# Patient Record
Sex: Female | Born: 1970 | Race: White | Hispanic: No | Marital: Married | Smoking: Never smoker
Health system: Southern US, Community
[De-identification: ages and names within clinical notes are randomized; demographics above are authoritative.]

## PROBLEM LIST (undated history)

## (undated) DIAGNOSIS — Z91018 Allergy to other foods: Secondary | ICD-10-CM

## (undated) DIAGNOSIS — I10 Essential (primary) hypertension: Secondary | ICD-10-CM

## (undated) DIAGNOSIS — R0683 Snoring: Secondary | ICD-10-CM

## (undated) DIAGNOSIS — R000 Tachycardia, unspecified: Secondary | ICD-10-CM

## (undated) DIAGNOSIS — I441 Atrioventricular block, second degree: Secondary | ICD-10-CM

## (undated) HISTORY — PX: ABLATION: SHX5711

## (undated) HISTORY — DX: Tachycardia, unspecified: R00.0

## (undated) HISTORY — PX: TUBAL LIGATION: SHX77

## (undated) HISTORY — DX: Essential (primary) hypertension: I10

## (undated) HISTORY — DX: Atrioventricular block, second degree: I44.1

## (undated) HISTORY — DX: Allergy to other foods: Z91.018

## (undated) HISTORY — PX: CHOLECYSTECTOMY: SHX55

## (undated) HISTORY — DX: Snoring: R06.83

---

## 2009-04-05 LAB — CONVERTED CEMR LAB: Pap Smear: NORMAL

## 2010-06-15 ENCOUNTER — Other Ambulatory Visit: Admission: RE | Admit: 2010-06-15 | Discharge: 2010-06-15 | Payer: Self-pay | Admitting: Family Medicine

## 2010-06-15 ENCOUNTER — Ambulatory Visit: Payer: Self-pay | Admitting: Family Medicine

## 2010-06-15 DIAGNOSIS — Z8711 Personal history of peptic ulcer disease: Secondary | ICD-10-CM

## 2010-06-15 DIAGNOSIS — E781 Pure hyperglyceridemia: Secondary | ICD-10-CM

## 2010-06-15 DIAGNOSIS — Z9189 Other specified personal risk factors, not elsewhere classified: Secondary | ICD-10-CM | POA: Insufficient documentation

## 2010-06-15 DIAGNOSIS — M722 Plantar fascial fibromatosis: Secondary | ICD-10-CM | POA: Insufficient documentation

## 2010-06-15 DIAGNOSIS — I10 Essential (primary) hypertension: Secondary | ICD-10-CM | POA: Insufficient documentation

## 2010-06-15 DIAGNOSIS — J45909 Unspecified asthma, uncomplicated: Secondary | ICD-10-CM

## 2010-06-15 DIAGNOSIS — G43009 Migraine without aura, not intractable, without status migrainosus: Secondary | ICD-10-CM | POA: Insufficient documentation

## 2010-06-15 DIAGNOSIS — J309 Allergic rhinitis, unspecified: Secondary | ICD-10-CM | POA: Insufficient documentation

## 2010-06-17 LAB — CONVERTED CEMR LAB
ALT: 15 units/L (ref 0–35)
AST: 14 units/L (ref 0–37)
Albumin: 4.1 g/dL (ref 3.5–5.2)
BUN: 14 mg/dL (ref 6–23)
Chloride: 103 meq/L (ref 96–112)
Cholesterol: 217 mg/dL — ABNORMAL HIGH (ref 0–200)
Creatinine, Ser: 0.7 mg/dL (ref 0.4–1.2)
Direct LDL: 83 mg/dL
Glucose, Bld: 84 mg/dL (ref 70–99)
HDL: 39.4 mg/dL (ref >39.00)
Total Bilirubin: 0.7 mg/dL (ref 0.3–1.2)
VLDL: 44.8 mg/dL — ABNORMAL HIGH (ref 0.0–40.0)

## 2010-06-22 LAB — CONVERTED CEMR LAB: Pap Smear: NEGATIVE

## 2010-06-23 ENCOUNTER — Encounter (INDEPENDENT_AMBULATORY_CARE_PROVIDER_SITE_OTHER): Payer: Self-pay | Admitting: *Deleted

## 2010-10-05 NOTE — Letter (Signed)
Summary: Results Follow up Letter  [place] [facility]  [address] [place], [place] [address]   Phone: [phone]  Fax: [phone]    06/23/2010 MRN: [id number]     [facility] [address] [place], [place]  [address]    Dear [name],  The following are the results of your recent test(s):  Test         Result    Pap Smear:        Normal __x___  Not Normal _____ Comments:Repeat in 1 year ______________________________________________________ Cholesterol: LDL(Bad cholesterol):         Your goal is less than:         HDL (Good cholesterol):       Your goal is more than: Comments:  ______________________________________________________ Mammogram:        Normal _____  Not Normal _____ Comments:  ___________________________________________________________________ Hemoccult:        Normal _____  Not normal _______ Comments:    _____________________________________________________________________ Other Tests:    We routinely do not discuss normal results over the telephone.  If you desire a copy of the results, or you have any questions about this information we can discuss them at your next office visit.   Sincerely,  Kerby Nora MD

## 2010-10-05 NOTE — Assessment & Plan Note (Signed)
Summary: NEW PT TO EST/CPX/PAP SMEAR/CLE   Vital Signs:  Patient profile:   40 year old female Height:      67 inches Weight:      188 pounds BMI:     29.55 Temp:     98.6 degrees F oral Pulse rate:   76 / minute Pulse rhythm:   regular BP sitting:   130 / 90  (left arm) Cuff size:   regular  Vitals Entered By: Benny Lennert CMA Duncan Dull) (June 15, 2010 9:55 AM)  History of Present Illness: Chief complaint new patient to be established   [place] from [place], [place].  Here to establish.  The patient is here for annual wellness exam and preventative care.    Allergies, mild worsening control..since moving. USes Zyrtec intermittantly.  HTN, well controlled on Bystolic since 10/2008. Strong family history of HTN.  Migraines, occuring 1 every 1-2 months.  Suing excedrine migraine or aleve. Triptans didn't help in past.   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  HAd nml iver, kidney, nml chol..mild increase in triglycerides, no DM....last year.   Hypertension History:      She denies headache, chest pain, palpitations, dyspnea with exertion, peripheral edema, neurologic problems, and syncope.        Positive major cardiovascular risk factors include hyperlipidemia and hypertension.  Negative major cardiovascular risk factors include female age less than 69 years old and non-tobacco-user status.    Preventive Screening-Counseling & Management  Alcohol-Tobacco     Smoking Status: never  Caffeine-Diet-Exercise     Does Patient Exercise: no      Drug Use:  no.    Problems Prior to Update: None  Current Medications (verified): 1)  None  Past History:  Past medical, surgical, family and social histories (including risk factors) reviewed, and no changes noted (except as noted below).  Past Medical History: Current Problems:  ALLERGIC RHINITIS (ICD-477.9) HYPERTENSION (ICD-401.9) GASTRIC ULCER, HX OF (ICD-V12.71) COMMON MIGRAINE  (ICD-346.10) CHICKENPOX, HX OF (ICD-V15.9) ASTHMA (ICD-493.90)    Past Surgical History: gallbladder 1998 csection 2000 tubal 06/2006 S/P novasure..no menses.  Family History: Reviewed history and no changes required. Family History of Arthritis Family History of Colon CA 1st degree relative <60 Family History Diabetes 1st degree relative Family History Hypertension Family History of Respiratory disease(CPOD, ENPHYSEMA)  father: HTN, COPD,  mother: healthy, DDD brtoher: HTN, DDD  Social History: Reviewed history and no changes required. 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 Drug use-no Regular exercise-no  4 children  Diet: healthy, fruits and veggies, water, mild calcium intake. Occupation:  employed Smoking Status:  never Drug Use:  no Does Patient Exercise:  no  Physical Exam  General:  Well-developed,well-nourished,in no acute distress; alert,appropriate and cooperative throughout examination Eyes:  No corneal or conjunctival inflammation noted. EOMI. Perrla. Funduscopic exam benign, without hemorrhages, exudates or papilledema. Vision grossly normal. Ears:  External ear exam shows no significant lesions or deformities.  Otoscopic examination reveals clear canals, tympanic membranes are intact bilaterally without bulging, retraction, inflammation or discharge. Hearing is grossly normal bilaterally. Nose:  External nasal examination shows no deformity or inflammation. Nasal mucosa are pink and moist without lesions or exudates. Mouth:  Oral mucosa and oropharynx without lesions or exudates.  Teeth in good repair. Neck:  no carotid bruit or thyromegaly no cervical or supraclavicular lymphadenopathy  Breasts:  No mass, nodules, thickening, tenderness, bulging, retraction, inflamation, nipple discharge or skin changes noted.  Lungs:  Normal respiratory effort, chest expands symmetrically. Lungs are clear to auscultation,  no crackles or wheezes. Heart:  Normal rate and regular rhythm. S1 and S2 normal without gallop, murmur, click, rub or other extra sounds. Abdomen:  Bowel sounds positive,abdomen soft and non-tender without masses, organomegaly or hernias noted. Genitalia:  Pelvic Exam:        External: normal female genitalia without lesions or masses        Vagina: normal without lesions or masses        Cervix: normal without lesions or masses        Adnexa: normal bimanual exam without masses or fullness        Uterus: normal by palpation        Pap smear: performed Pulses:  R and L posterior tibial pulses are full and equal bilaterally  Extremities:  no edema Neurologic:  No cranial nerve deficits noted. Station and gait are normal. Plantar reflexes are down-going bilaterally. DTRs are symmetrical throughout. Sensory, motor and coordinative functions appear intact. Skin:  Intact without suspicious lesions or rashes Psych:  Cognition and judgment appear intact. Alert and cooperative with normal attention span and concentration. No apparent delusions, illusions, hallucinations   Impression & Recommendations:  Problem # 1:  PREVENTIVE HEALTH CARE (ICD-V70.0) The patient's preventative maintenance and recommended screening tests for an annual wellness exam were reviewed in full today. Brought up to date unless services declined.  Counselled on the importance of diet, exercise, and its role in overall health and mortality. The patient's FH and SH was reviewed, including their home life, tobacco status, and drug and alcohol status.     Problem # 2:  ROUTINE GYNECOLOGICAL EXAMINATION (ICD-V72.31) PAP pending.Marland Kitchenobtain last 3 years of paps...if nml space every 2-3 years.   Problem # 3:  HYPERTRIGLYCERIDEMIA (ICD-272.1)  Orders: TLB-Lipid Panel (80061-LIPID)  Problem # 4:  HYPERTENSION (ICD-401.9) Well controlled. Continue current medication.  Orders: TLB-BMP (Basic Metabolic Panel-BMET)  (80048-METABOL) TLB-Hepatic/Liver Function Pnl (80076-HEPATIC)  Hypertension Assessment/Plan:      The patient's hypertensive risk group is category B: At least one risk factor (excluding diabetes) with no target organ damage.  Today's blood pressure is 130/90.  Her blood pressure goal is < 140/90.   Patient Instructions: 1)  Change tylenol PM to tylenol alone..try to use only as needed. 2)  Start regular exercsie as tolerated..consider water exercise. 3)  Take calcium/vit D 600mg /400 IU daily. 4)  Please schedule a follow-up appointment in 1 year.   Flu Vaccine Result Date:  06/15/2010 Flu Vaccine Result:  given Flu Vaccine Next Due:  1 yr TD Result Date:  06/15/2009 TD Result:  given TD Next Due:  10 yr PAP Result Date:  04/05/2009 PAP Result:  normal PAP Next Due:  1 yr HAs normal PAP s yearly always nml.   Appended Document: NEW PT TO EST/CPX/PAP SMEAR/CLE Flu Vaccine Consent Questions     Do you have a history of severe allergic reactions to this vaccine? no    Any prior history of allergic reactions to egg and/or gelatin? no    Do you have a sensitivity to the preservative Thimersol? no    Do you have a past history of Guillan-Barre Syndrome? no    Do you currently have an acute febrile illness? no    Have you ever had a severe reaction to latex? no    Vaccine information given and explained to patient? yes    Are you currently pregnant? no  Lot Number:[id number]   Exp Date:03/05/2011   Site Given  Left Deltoid IM    Clinical Lists Changes  Orders: Added new Service order of Admin 1st Vaccine ([address]) - Signed Added new Service order of Flu Vaccine 24yrs + [phone]) - Signed Observations: Added new observation of FLU VAX VIS: 03/30/10 version (06/15/2010 11:16) Added new observation of FLU VAXLOT: [id number] (06/15/2010 11:16) Added new observation of FLU VAXMFR: [employer] (06/15/2010 11:16) Added new observation of FLU VAX EXP: 03/05/2011 (06/15/2010  11:16) Added new observation of FLU VAX DSE: 0.39ml (06/15/2010 11:16) Added new observation of FLU VAX: Fluvax 3+ (06/15/2010 11:16)

## 2010-10-05 NOTE — Letter (Signed)
Summary: Records Dated 10-08-07 thru 04-15-09/Christopher Radpour MD  Records Dated 10-08-07 thru 04-15-09/Christopher Radpour MD   Imported By: Lanelle Bal 06/29/2010 08:38:53  _____________________________________________________________________  External Attachment:    Type:   Image     Comment:   External Document

## 2012-07-02 ENCOUNTER — Encounter: Payer: Self-pay | Admitting: Family Medicine

## 2012-07-02 ENCOUNTER — Ambulatory Visit (INDEPENDENT_AMBULATORY_CARE_PROVIDER_SITE_OTHER): Payer: BC Managed Care – PPO | Admitting: Family Medicine

## 2012-07-02 VITALS — BP 140/90 | HR 72 | Temp 98.2°F | Wt 208.0 lb

## 2012-07-02 DIAGNOSIS — J029 Acute pharyngitis, unspecified: Secondary | ICD-10-CM

## 2012-07-02 DIAGNOSIS — I10 Essential (primary) hypertension: Secondary | ICD-10-CM

## 2012-07-02 DIAGNOSIS — Z136 Encounter for screening for cardiovascular disorders: Secondary | ICD-10-CM

## 2012-07-02 LAB — COMPREHENSIVE METABOLIC PANEL
ALT: 20 U/L (ref 0–35)
AST: 18 U/L (ref 0–37)
Alkaline Phosphatase: 60 U/L (ref 39–117)
BUN: 11 mg/dL (ref 6–23)
Calcium: 8.9 mg/dL (ref 8.4–10.5)
Chloride: 104 mEq/L (ref 96–112)
Creatinine, Ser: 0.8 mg/dL (ref 0.4–1.2)
Total Bilirubin: 0.7 mg/dL (ref 0.3–1.2)

## 2012-07-02 LAB — LDL CHOLESTEROL, DIRECT: Direct LDL: 107.7 mg/dL

## 2012-07-02 LAB — LIPID PANEL
HDL: 43.1 mg/dL (ref >39.00)
Total CHOL/HDL Ratio: 7
Triglycerides: 419 mg/dL — ABNORMAL HIGH (ref 0.0–149.0)
VLDL: 83.8 mg/dL — ABNORMAL HIGH (ref 0.0–40.0)

## 2012-07-02 LAB — POCT RAPID STREP A (OFFICE): Rapid Strep A Screen: NEGATIVE

## 2012-07-02 MED ORDER — PENICILLIN G BENZATHINE 600000 UNIT/ML IM SUSP: 600000.0000 [IU] | Freq: Once | INTRAMUSCULAR | Status: DC

## 2012-07-02 MED ORDER — PENICILLIN G BENZATHINE 600000 UNIT/ML IM SUSP: 60000.0000 [IU] | Freq: Once | INTRAMUSCULAR | Status: DC

## 2012-07-02 MED ORDER — NEBIVOLOL HCL 10 MG PO TABS: 10.0000 mg | ORAL_TABLET | Freq: Every day | ORAL | Status: DC

## 2012-07-02 MED ORDER — PENICILLIN G BENZATHINE 1200000 UNIT/2ML IM SUSP
1.2000 10*6.[IU] | Freq: Once | INTRAMUSCULAR | Status: AC
  Administered 2012-07-02: 1.2 10*6.[IU] via INTRAMUSCULAR

## 2012-07-02 NOTE — Progress Notes (Signed)
  Subjective:    Patient ID: Alexis Hoffman, female    DOB: 03/04/1971, 41 y.o.   MRN: [id number]  HPI  41 yo pt of Dr. Ermalene Searing who has not been here since 2011 here for:  1. Strep throat- She complains of sore throat, myalgias, swollen glands, headache and fever for 20 days. No history of rheumatic fever.   She was treated for strep throat two weeks ago with [facility]N 500 [facility] but feels symptoms are persisting.   2.  BP medication refill.  Has been on Bystolic since 2010.  Strong FH of HTN. Denies any HA, blurred vision, CP or SOB.  Patient Active Problem List  Diagnosis  . HYPERTRIGLYCERIDEMIA  . COMMON MIGRAINE  . HYPERTENSION  . ALLERGIC RHINITIS  . ASTHMA, INTERMITTENT, MILD  . PLANTAR FASCIITIS, RIGHT  . GASTRIC ULCER, HX OF  . CHICKENPOX, HX OF  . Pharyngitis   Past Medical History  Diagnosis Date  . Hypertension    Past Surgical History  Procedure Date  . Cholecystectomy   . Tubal ligation   . Cesarean section    History  Substance Use Topics  . Smoking status: Never Smoker   . Smokeless tobacco: Not on file  . Alcohol Use: Not on file   No family history on file. Allergies  Allergen Reactions  . Codeine Nausea And Vomiting  . Vicodin (Hydrocodone-Acetaminophen) Hives   Current Outpatient Prescriptions on File Prior to Visit  Medication Sig Dispense Refill  . nebivolol (BYSTOLIC) 10 MG tablet Take 1 tablet (10 mg total) by mouth daily.  90 tablet  3   The PMH, PSH, Social History, Family History, Medications, and allergies have been reviewed in [facility], and have been updated if relevant.     Review of Systems See HPI    Objective:   Physical Exam  BP 140/90  Pulse 72  Temp 98.2 F (36.8 C)  Wt 208 lb (94.348 kg)  Appears alert, well appearing, and in no distress. Ears: bilateral TM's and external ear canals normal Oropharynx: erythematous and tonsils hypertrophied with exudate Neck: supple, no significant  adenopathy Lungs: clear to auscultation, no wheezes, rales or rhonchi, symmetric air entry Rapid Strep test is negative      Assessment & Plan:   1. HYPERTENSION  Stable.  Bystolic refilled.  Check CMET today since she has not had labs checked since 2011. Comprehensive metabolic panel  2. Pharyngitis  Rapid strep neg but she was not treated with adequate dose of PCN.  benzathine penicillin G (single dose) given IM in office.  Continue supportive care.  POCT rapid strep A  3. Screening for ischemic heart disease  Lipid Panel

## 2012-07-02 NOTE — Patient Instructions (Addendum)
Nice to meet you. We will call you with your lab results.   

## 2012-07-02 NOTE — Addendum Note (Signed)
Addended by: Eliezer Bottom on: 07/02/2012 12:59 PM   Modules accepted: Orders

## 2012-07-02 NOTE — Addendum Note (Signed)
Addended by: Eliezer Bottom on: 07/02/2012 05:53 PM   Modules accepted: Orders

## 2012-07-03 ENCOUNTER — Telehealth: Payer: Self-pay | Admitting: *Deleted

## 2012-07-03 NOTE — Telephone Encounter (Signed)
Pt states she had to buy bystolic for the first time and it costs her $64 for a 30 day supply.  She scheduled appt for physical on 09/06/12, will discuss this with you then.  We do have samples of bystolic 10 mg's.  Ok to give her enough to last until her appt?

## 2012-07-04 ENCOUNTER — Other Ambulatory Visit: Payer: Self-pay | Admitting: Family Medicine

## 2012-07-04 MED ORDER — FENOFIBRATE 145 MG PO TABS: 145.0000 mg | ORAL_TABLET | Freq: Every day | ORAL | Status: DC

## 2012-07-04 NOTE — Telephone Encounter (Signed)
Samples given- Bystolic 10 mg's. 8 boxes of # 7 each, lot number [id number] exp 12/2013.  Pt advised.

## 2012-07-04 NOTE — Telephone Encounter (Signed)
Yes ok to give samples.

## 2012-09-06 ENCOUNTER — Encounter: Payer: BC Managed Care – PPO | Admitting: Family Medicine

## 2012-09-14 ENCOUNTER — Other Ambulatory Visit: Payer: Self-pay | Admitting: *Deleted

## 2012-09-14 MED ORDER — FENOFIBRATE 145 MG PO TABS: 145.0000 mg | ORAL_TABLET | Freq: Every day | ORAL | Status: DC

## 2013-07-01 ENCOUNTER — Ambulatory Visit (INDEPENDENT_AMBULATORY_CARE_PROVIDER_SITE_OTHER): Payer: BC Managed Care – PPO | Admitting: Family Medicine

## 2013-07-01 ENCOUNTER — Encounter: Payer: BC Managed Care – PPO | Admitting: Family Medicine

## 2013-07-01 ENCOUNTER — Encounter: Payer: Self-pay | Admitting: Family Medicine

## 2013-07-01 VITALS — BP 120/78 | HR 74 | Temp 98.1°F | Ht 67.0 in | Wt 202.2 lb

## 2013-07-01 DIAGNOSIS — Z23 Encounter for immunization: Secondary | ICD-10-CM

## 2013-07-01 DIAGNOSIS — E781 Pure hyperglyceridemia: Secondary | ICD-10-CM

## 2013-07-01 DIAGNOSIS — E785 Hyperlipidemia, unspecified: Secondary | ICD-10-CM

## 2013-07-01 DIAGNOSIS — Z79899 Other long term (current) drug therapy: Secondary | ICD-10-CM

## 2013-07-01 DIAGNOSIS — I10 Essential (primary) hypertension: Secondary | ICD-10-CM

## 2013-07-01 LAB — BASIC METABOLIC PANEL
CO2: 29 mEq/L (ref 19–32)
Chloride: 103 mEq/L (ref 96–112)
Sodium: 140 mEq/L (ref 135–145)

## 2013-07-01 LAB — CBC WITH DIFFERENTIAL/PLATELET
Basophils Relative: 0.3 % (ref 0.0–3.0)
Eosinophils Absolute: 0.2 10*3/uL (ref 0.0–0.7)
HCT: 41.7 % (ref 36.0–46.0)
Hemoglobin: 14.3 g/dL (ref 12.0–15.0)
MCHC: 34.2 g/dL (ref 30.0–36.0)
MCV: 89.8 fl (ref 78.0–100.0)
Monocytes Absolute: 0.5 10*3/uL (ref 0.1–1.0)
Neutro Abs: 4.8 10*3/uL (ref 1.4–7.7)
RBC: 4.64 Mil/uL (ref 3.87–5.11)

## 2013-07-01 LAB — HEPATIC FUNCTION PANEL
Albumin: 4.1 g/dL (ref 3.5–5.2)
Total Protein: 7 g/dL (ref 6.0–8.3)

## 2013-07-01 LAB — LIPID PANEL
Cholesterol: 200 mg/dL (ref 0–200)
HDL: 44.3 mg/dL (ref >39.00)
Total CHOL/HDL Ratio: 5
Triglycerides: 283 mg/dL — ABNORMAL HIGH (ref 0.0–149.0)
VLDL: 56.6 mg/dL — ABNORMAL HIGH (ref 0.0–40.0)

## 2013-07-01 MED ORDER — BISOPROLOL FUMARATE 10 MG PO TABS: 10.0000 mg | ORAL_TABLET | Freq: Every day | ORAL | Status: DC

## 2013-07-01 NOTE — Progress Notes (Signed)
Date:  07/01/2013   Name:  Alexis Hoffman   DOB:  1971-01-01   MRN:  [id number] Gender: female Age: 42 y.o.  Primary Physician:  Ruthe Mannan, MD   Chief Complaint: Medication Refill   History of Present Illness:  Alexis Hoffman is a 42 y.o. pleasant patient who presents with the following:  HTN: Tolerating all medications without side effects Stable and at goal No CP, no sob. No HA.  BP Readings from Last 3 Encounters:  07/01/13 120/78  07/02/12 140/90  06/15/10 130/90    Basic Metabolic Panel:    Component Value Date/Time   NA 138 07/02/2012 1025   K 4.5 07/02/2012 1025   CL 104 07/02/2012 1025   CO2 29 07/02/2012 1025   BUN 11 07/02/2012 1025   CREATININE 0.8 07/02/2012 1025   GLUCOSE 85 07/02/2012 1025   CALCIUM 8.9 07/02/2012 1025     Lipids: Has been off of tricor for a couple of months Panel reviewed with patient.  Lipids:    Component Value Date/Time   CHOL 312* 07/02/2012 1025   TRIG 419.0* 07/02/2012 1025   HDL 43.10 07/02/2012 1025   LDLDIRECT 107.7 07/02/2012 1025   VLDL 83.8* 07/02/2012 1025   CHOLHDL 7 07/02/2012 1025    Lab Results  Component Value Date   ALT 20 07/02/2012   AST 18 07/02/2012   ALKPHOS 60 07/02/2012   BILITOT 0.7 07/02/2012     Patient Active Problem List   Diagnosis Date Noted  . Pharyngitis 07/02/2012  . HYPERTRIGLYCERIDEMIA 06/15/2010  . COMMON MIGRAINE 06/15/2010  . HYPERTENSION 06/15/2010  . ALLERGIC RHINITIS 06/15/2010  . ASTHMA, INTERMITTENT, MILD 06/15/2010  . PLANTAR FASCIITIS, RIGHT 06/15/2010  . GASTRIC ULCER, HX OF 06/15/2010  . CHICKENPOX, HX OF 06/15/2010    Past Medical History  Diagnosis Date  . Hypertension     Past Surgical History  Procedure Laterality Date  . Cholecystectomy    . Tubal ligation    . Cesarean section      History   Social History  . Marital Status: Married    Spouse Name: N/A    Number of Children: N/A  . Years of Education: N/A   Occupational History  .  Not on file.   Social History Main Topics  . Smoking status: Never Smoker   . Smokeless tobacco: Never Used  . Alcohol Use: Yes     Comment: glass of wine at night  . Drug Use: No  . Sexual Activity: Not on file   Other Topics Concern  . Not on file   Social History Narrative  . No narrative on file    No family history on file.  Allergies  Allergen Reactions  . Codeine Nausea And Vomiting  . Vicodin [Hydrocodone-Acetaminophen] Hives    Medication list has been reviewed and updated.  Outpatient Prescriptions Prior to Visit  Medication Sig Dispense Refill  . nebivolol (BYSTOLIC) 10 MG tablet Take 1 tablet (10 mg total) by mouth daily.  90 tablet  3  . fenofibrate (TRICOR) 145 MG tablet Take 1 tablet (145 mg total) by mouth daily. * Needs appointment for fasting labs for additional refills*  30 tablet  0   Facility-Administered Medications Prior to Visit  Medication Dose Route Frequency Provider Last Rate Last Dose  . penicillin G benzathine (BICILLIN-LA) 600000 UNIT/ML injection 600,000 Units  600,000 Units Intramuscular Once Dianne Dun, MD        Review of Systems:   GEN: No  acute illnesses, no fevers, chills. GI: No n/v/d, eating normally Pulm: No SOB Interactive and getting along well at home.  Otherwise, ROS is as per the HPI.   Physical Examination: BP 120/78  Pulse 74  Temp(Src) 98.1 F (36.7 C) (Oral)  Ht 5\' 7"  (1.702 m)  Wt 202 lb 4 oz (91.74 kg)  BMI 31.67 kg/m2  Ideal Body Weight: Weight in (lb) to have BMI = 25: 159.3   GEN: WDWN, NAD, Non-toxic, A & O x 3 HEENT: Atraumatic, Normocephalic. Neck supple. No masses, No LAD. Ears and Nose: No external deformity. CV: RRR, No M/G/R. No JVD. No thrill. No extra heart sounds. PULM: CTA B, no wheezes, crackles, rhonchi. No retractions. No resp. distress. No accessory muscle use. EXTR: No c/c/e NEURO Normal gait.  PSYCH: Normally interactive. Conversant. Not depressed or anxious appearing.  Calm  demeanor.    Assessment and Plan:  Unspecified essential hypertension  Need for prophylactic vaccination and inoculation against influenza - Plan: Flu Vaccine QUAD 36+ mos PF IM (Fluarix)  Other and unspecified hyperlipidemia - Plan: Lipid panel  Encounter for long-term (current) use of other medications - Plan: Basic metabolic panel, CBC with Differential, Hepatic function panel  HYPERTRIGLYCERIDEMIA   Change to bisoprolol for financial reasons Check lipids  Orders Today:  Orders Placed This Encounter  Procedures  . Flu Vaccine QUAD 36+ mos PF IM (Fluarix)  . Basic metabolic panel  . CBC with Differential  . Hepatic function panel  . Lipid panel    Updated Medication List: (Includes new medications, updates to list, dose adjustments) Meds ordered this encounter  Medications  . diphenhydrAMINE (BENADRYL) 25 MG tablet    Sig: Take 25 mg by mouth at bedtime as needed for itching.  . bisoprolol (ZEBETA) 10 MG tablet    Sig: Take 1 tablet (10 mg total) by mouth daily.    Dispense:  90 tablet    Refill:  3    Medications Discontinued: Medications Discontinued During This Encounter  Medication Reason  . nebivolol (BYSTOLIC) 10 MG tablet   . fenofibrate (TRICOR) 145 MG tablet       Signed,  Nadiyah Zeis T. Naquisha Whitehair, MD, CAQ Sports Medicine  [employer] [facility] [address] [place] [address] Phone: [phone] Fax: [phone]

## 2013-08-19 ENCOUNTER — Ambulatory Visit (INDEPENDENT_AMBULATORY_CARE_PROVIDER_SITE_OTHER): Payer: BC Managed Care – PPO | Admitting: Family Medicine

## 2013-08-19 ENCOUNTER — Encounter: Payer: Self-pay | Admitting: Family Medicine

## 2013-08-19 VITALS — BP 108/70 | HR 82 | Temp 98.1°F | Wt 199.8 lb

## 2013-08-19 DIAGNOSIS — M549 Dorsalgia, unspecified: Secondary | ICD-10-CM

## 2013-08-19 DIAGNOSIS — N10 Acute tubulo-interstitial nephritis: Secondary | ICD-10-CM

## 2013-08-19 LAB — POCT URINALYSIS DIPSTICK
Glucose, UA: NEGATIVE
Nitrite, UA: NEGATIVE
Spec Grav, UA: 1.025
Urobilinogen, UA: 0.2
pH, UA: 5

## 2013-08-19 MED ORDER — CEPHALEXIN 500 MG PO CAPS: 500.0000 mg | ORAL_CAPSULE | Freq: Two times a day (BID) | ORAL | Status: DC

## 2013-08-19 MED ORDER — CEFTRIAXONE SODIUM 1 G IJ SOLR
1.0000 g | Freq: Once | INTRAMUSCULAR | Status: AC
  Administered 2013-08-19: 1 g via INTRAMUSCULAR

## 2013-08-19 NOTE — Progress Notes (Signed)
Pre-visit discussion using our clinic review tool. No additional management support is needed unless otherwise documented below in the visit note.  

## 2013-08-19 NOTE — Addendum Note (Signed)
Addended by: Sydell Axon C on: 08/19/2013 03:32 PM   Modules accepted: Orders

## 2013-08-19 NOTE — Patient Instructions (Signed)
I am worried about developing kidney infection given symptoms - treat with rocephin 1gm IM today, then start keflex twice daily for 7 days (sent to pharmacy) Push fluids and rest.  May use tylenol for discomfort as needed. Please let us know if symptoms worsen or fail to improve with above treatment.  Pyelonephritis, Adult Pyelonephritis is a kidney infection. In general, there are 2 main types of pyelonephritis:  Infections that come on quickly without any warning (acute pyelonephritis).  Infections that persist for a long period of time (chronic pyelonephritis). CAUSES  Two main causes of pyelonephritis are:  Bacteria traveling from the bladder to the kidney. This is a problem especially in pregnant women. The urine in the bladder can become filled with bacteria from multiple causes, including:  Inflammation of the prostate gland (prostatitis).  Sexual intercourse in females.  Bladder infection (cystitis).  Bacteria traveling from the bloodstream to the tissue part of the kidney. Problems that may increase your risk of getting a kidney infection include:  Diabetes.  Kidney stones or bladder stones.  Cancer.  Catheters placed in the bladder.  Other abnormalities of the kidney or ureter. SYMPTOMS   Abdominal pain.  Pain in the side or flank area.  Fever.  Chills.  Upset stomach.  Blood in the urine (dark urine).  Frequent urination.  Strong or persistent urge to urinate.  Burning or stinging when urinating. DIAGNOSIS  Your caregiver may diagnose your kidney infection based on your symptoms. A urine sample may also be taken. TREATMENT  In general, treatment depends on how severe the infection is.   If the infection is mild and caught early, your caregiver may treat you with oral antibiotics and send you home.  If the infection is more severe, the bacteria may have gotten into the bloodstream. This will require intravenous (IV) antibiotics and a hospital stay.  Symptoms may include:  High fever.  Severe flank pain.  Shaking chills.  Even after a hospital stay, your caregiver may require you to be on oral antibiotics for a period of time.  Other treatments may be required depending upon the cause of the infection. HOME CARE INSTRUCTIONS   Take your antibiotics as directed. Finish them even if you start to feel better.  Make an appointment to have your urine checked to make sure the infection is gone.  Drink enough fluids to keep your urine clear or pale yellow.  Take medicines for the bladder if you have urgency and frequency of urination as directed by your caregiver. SEEK IMMEDIATE MEDICAL CARE IF:   You have a fever or persistent symptoms for more than 2-3 days.  You have a fever and your symptoms suddenly get worse.  You are unable to take your antibiotics or fluids.  You develop shaking chills.  You experience extreme weakness or fainting.  There is no improvement after 2 days of treatment. MAKE SURE YOU:  Understand these instructions.  Will watch your condition.  Will get help right away if you are not doing well or get worse. Document Released: 08/22/2005 Document Revised: 02/21/2012 Document Reviewed: 01/26/2011 [facility] Patient Information 2014 [place], [place].

## 2013-08-19 NOTE — Assessment & Plan Note (Addendum)
Given progression of sxs, anticipate she has developed acute pyelo after failing oral bactrim treatment Treat with rocephin 1gm IM today, then start keflex 500mg  twice daily for 7 days. Red flags to return discussed. Pt agrees with plan.

## 2013-08-19 NOTE — Progress Notes (Signed)
   Subjective:    Patient ID: Alexis Hoffman, female    DOB: [birth year], 42 y.o.   MRN: [id number]  HPI CC: ?UTI  1 wk h/o increased frequency, hematuria, dysuria,  [facility] - did not test urine 2/2 azo use.  Treated with 3d antibiotics (septra bid) - continued sxs.  Yesterday started having L flank pain.  Some chills yesterday.  Mild lower abd pain.  No fevers, nausea/vomiting.  No ho UTIs in the past.   Drinking water and cranberry juice, using azo and advil.  Past Medical History  Diagnosis Date  . Hypertension      Review of Systems Per HPI    Objective:   Physical Exam  Nursing note and vitals reviewed. Constitutional: She appears well-developed and well-nourished. No distress.  Abdominal: Soft. Normal appearance and bowel sounds are normal. She exhibits no distension and no mass. There is tenderness (mild) in the suprapubic area. There is CVA tenderness (mild R sided). There is no rigidity, no rebound, no guarding and negative Murphy's sign.  Skin: Skin is warm and dry. No rash noted.  Psychiatric: She has a normal mood and affect.       Assessment & Plan:

## 2013-08-20 LAB — URINE CULTURE
Colony Count: NO GROWTH
Organism ID, Bacteria: NO GROWTH

## 2013-12-23 ENCOUNTER — Other Ambulatory Visit: Payer: Self-pay | Admitting: Family Medicine

## 2013-12-23 ENCOUNTER — Other Ambulatory Visit (INDEPENDENT_AMBULATORY_CARE_PROVIDER_SITE_OTHER): Payer: BC Managed Care – PPO

## 2013-12-23 DIAGNOSIS — Z136 Encounter for screening for cardiovascular disorders: Secondary | ICD-10-CM

## 2013-12-23 DIAGNOSIS — Z0000 Encounter for general adult medical examination without abnormal findings: Secondary | ICD-10-CM

## 2013-12-23 LAB — COMPREHENSIVE METABOLIC PANEL
ALBUMIN: 3.9 g/dL (ref 3.5–5.2)
ALT: 18 U/L (ref 0–35)
AST: 17 U/L (ref 0–37)
Alkaline Phosphatase: 53 U/L (ref 39–117)
BUN: 14 mg/dL (ref 6–23)
CALCIUM: 9.2 mg/dL (ref 8.4–10.5)
CO2: 26 meq/L (ref 19–32)
CREATININE: 0.8 mg/dL (ref 0.4–1.2)
Chloride: 105 mEq/L (ref 96–112)
GFR: 86.81 mL/min (ref >60.00)
Glucose, Bld: 88 mg/dL (ref 70–99)
POTASSIUM: 4.4 meq/L (ref 3.5–5.1)
SODIUM: 140 meq/L (ref 135–145)
TOTAL PROTEIN: 6.9 g/dL (ref 6.0–8.3)
Total Bilirubin: 0.8 mg/dL (ref 0.3–1.2)

## 2013-12-23 LAB — CBC WITH DIFFERENTIAL/PLATELET
Basophils Absolute: 0 10*3/uL (ref 0.0–0.1)
Basophils Relative: 0.5 % (ref 0.0–3.0)
EOS PCT: 4.1 % (ref 0.0–5.0)
Eosinophils Absolute: 0.2 10*3/uL (ref 0.0–0.7)
HCT: 42 % (ref 36.0–46.0)
Hemoglobin: 14.3 g/dL (ref 12.0–15.0)
LYMPHS ABS: 1.6 10*3/uL (ref 0.7–4.0)
Lymphocytes Relative: 29.9 % (ref 12.0–46.0)
MCHC: 34.1 g/dL (ref 30.0–36.0)
MCV: 91.2 fl (ref 78.0–100.0)
Monocytes Absolute: 0.4 10*3/uL (ref 0.1–1.0)
Monocytes Relative: 6.8 % (ref 3.0–12.0)
NEUTROS ABS: 3.1 10*3/uL (ref 1.4–7.7)
Neutrophils Relative %: 58.7 % (ref 43.0–77.0)
Platelets: 216 10*3/uL (ref 150.0–400.0)
RBC: 4.61 Mil/uL (ref 3.87–5.11)
RDW: 13.4 % (ref 11.5–14.6)
WBC: 5.2 10*3/uL (ref 4.5–10.5)

## 2013-12-23 LAB — LIPID PANEL
CHOL/HDL RATIO: 6
Cholesterol: 246 mg/dL — ABNORMAL HIGH (ref 0–200)
HDL: 41.3 mg/dL (ref >39.00)
LDL Cholesterol: 152 mg/dL — ABNORMAL HIGH (ref 0–99)
Triglycerides: 263 mg/dL — ABNORMAL HIGH (ref 0.0–149.0)
VLDL: 52.6 mg/dL — ABNORMAL HIGH (ref 0.0–40.0)

## 2013-12-23 LAB — TSH: TSH: 0.49 u[IU]/mL (ref 0.35–5.50)

## 2013-12-30 ENCOUNTER — Ambulatory Visit (INDEPENDENT_AMBULATORY_CARE_PROVIDER_SITE_OTHER): Payer: BC Managed Care – PPO | Admitting: Family Medicine

## 2013-12-30 ENCOUNTER — Encounter: Payer: Self-pay | Admitting: Family Medicine

## 2013-12-30 ENCOUNTER — Other Ambulatory Visit (HOSPITAL_COMMUNITY)
Admission: RE | Admit: 2013-12-30 | Discharge: 2013-12-30 | Disposition: A | Discharge status: Home / Self Care | Payer: BC Managed Care – PPO | Source: Ambulatory Visit | Attending: Family Medicine | Admitting: Family Medicine

## 2013-12-30 VITALS — BP 126/82 | HR 71 | Temp 98.0°F | Ht 66.75 in | Wt 201.0 lb

## 2013-12-30 DIAGNOSIS — E781 Pure hyperglyceridemia: Secondary | ICD-10-CM

## 2013-12-30 DIAGNOSIS — Z1231 Encounter for screening mammogram for malignant neoplasm of breast: Secondary | ICD-10-CM

## 2013-12-30 DIAGNOSIS — Z01419 Encounter for gynecological examination (general) (routine) without abnormal findings: Secondary | ICD-10-CM | POA: Insufficient documentation

## 2013-12-30 DIAGNOSIS — Z0000 Encounter for general adult medical examination without abnormal findings: Secondary | ICD-10-CM | POA: Insufficient documentation

## 2013-12-30 DIAGNOSIS — I10 Essential (primary) hypertension: Secondary | ICD-10-CM

## 2013-12-30 DIAGNOSIS — Z1151 Encounter for screening for human papillomavirus (HPV): Secondary | ICD-10-CM | POA: Insufficient documentation

## 2013-12-30 NOTE — Assessment & Plan Note (Signed)
Well controlled but advised taking it in the morning to improve low BP early in am. She will continue to monitor and update me with her BP readings.

## 2013-12-30 NOTE — Patient Instructions (Addendum)
Great to see you. Please stop drinking protein shakes. Come back in 3 months to recheck labs.  Start taking your blood pressure medication in the morning.  Please call [facility] to set up your mammogram.

## 2013-12-30 NOTE — Progress Notes (Signed)
Subjective:   Patient ID: Alexis Hoffman, female    DOB: 1970/12/09, 43 y.o.   MRN: [id number]  Alexis Hoffman is a pleasant 43 y.o. year old female pt of Dr. Diona Browner who presents to clinic today with Annual Exam and Hypertension  on 12/30/2013  HPI: Pap smear 06/15/2010- done by Dr. Diona Browner. No h/o abnormal pap smears. S/p endometrial ablation and BTL. Has never had a mammogram. No immediate family of breast cancer, uterine or cervical CA.    She has a lot of home stressors but feels she is coping ok.  They are thinking about putting their autistic teenager in a group home.  She denies any anxiety or depression.  HTN- on bisoprolol 10 mg daily since 2010.  Strong FH of HTN.  Has been having low readings (97/67) in am and high readings in pm (157/98). Denies any dizziness, blurred vision, CP or SOB. She has been waking up with headaches for past two weeks.  HLD- LD and TG elevated.  She does not eat out and does not eat fried foods.  Strong FH of HLD.  Also has been drinking protein shakes and they are high in cholesterol. Lab Results  Component Value Date   CHOL 246* 12/23/2013   HDL 41.30 12/23/2013   LDLCALC 152* 12/23/2013   LDLDIRECT 75.0 07/01/2013   TRIG 263.0* 12/23/2013   CHOLHDL 6 12/23/2013   Wt Readings from Last 3 Encounters:  12/30/13 201 lb (91.173 kg)  08/19/13 199 lb 12 oz (90.606 kg)  07/01/13 202 lb 4 oz (91.74 kg)     Lab Results  Component Value Date   WBC 5.2 12/23/2013   HGB 14.3 12/23/2013   HCT 42.0 12/23/2013   MCV 91.2 12/23/2013   PLT 216.0 12/23/2013   Lab Results  Component Value Date   CREATININE 0.8 12/23/2013   Patient Active Problem List   Diagnosis Date Noted  . Routine general medical examination at a health care facility 12/30/2013  . Encounter for routine gynecological examination 12/30/2013  . HYPERTRIGLYCERIDEMIA 06/15/2010  . COMMON MIGRAINE 06/15/2010  . HYPERTENSION 06/15/2010  . ALLERGIC RHINITIS 06/15/2010  . ASTHMA,  INTERMITTENT, MILD 06/15/2010  . PLANTAR FASCIITIS, RIGHT 06/15/2010  . GASTRIC ULCER, HX OF 06/15/2010  . CHICKENPOX, HX OF 06/15/2010   Past Medical History  Diagnosis Date  . Hypertension    Past Surgical History  Procedure Laterality Date  . Cholecystectomy    . Tubal ligation    . Cesarean section     History  Substance Use Topics  . Smoking status: Never Smoker   . Smokeless tobacco: Never Used  . Alcohol Use: No     Comment: glass of wine at night   No family history on file. Allergies  Allergen Reactions  . Codeine Nausea And Vomiting  . Vicodin [Hydrocodone-Acetaminophen] Hives   Current Outpatient Prescriptions on File Prior to Visit  Medication Sig Dispense Refill  . bisoprolol (ZEBETA) 10 MG tablet Take 1 tablet (10 mg total) by mouth daily.  90 tablet  3  . diphenhydrAMINE (BENADRYL) 25 MG tablet Take 25 mg by mouth at bedtime as needed for itching.       Current Facility-Administered Medications on File Prior to Visit  Medication Dose Route Frequency Provider Last Rate Last Dose  . penicillin G benzathine (BICILLIN-LA) 600000 UNIT/ML injection 600,000 Units  600,000 Units Intramuscular Once Lucille Passy, MD       The PMH, PSH, Social History, Family History, Medications, and allergies  have been reviewed in [facility], and have been updated if relevant.    Review of Systems See HPI Patient reports no  vision/ hearing changes,anorexia, weight change, fever ,adenopathy, persistant / recurrent hoarseness, swallowing issues, chest pain, edema,persistant / recurrent cough, hemoptysis, dyspnea(rest, exertional, paroxysmal nocturnal), gastrointestinal  bleeding (melena, rectal bleeding), abdominal pain, excessive heart burn, GU symptoms(dysuria, hematuria, pyuria, voiding/incontinence  Issues) syncope, focal weakness, severe memory loss, concerning skin lesions, depression, anxiety, abnormal bruising/bleeding, major joint swelling, breast masses or abnormal vaginal bleeding.        Objective:    BP 126/82  Pulse 71  Temp(Src) 98 F (36.7 C) (Oral)  Ht 5' 6.75" (1.695 m)  Wt 201 lb (91.173 kg)  BMI 31.73 kg/m2  SpO2 98%   Physical Exam    General:  Well-developed,well-nourished,in no acute distress; alert,appropriate and cooperative throughout examination Head:  normocephalic and atraumatic.   Eyes:  vision grossly intact, pupils equal, pupils round, and pupils reactive to light.   Ears:  R ear normal and L ear normal.   Nose:  no external deformity.   Mouth:  good dentition.   Neck:  No deformities, masses, or tenderness noted. Breasts:  No mass, nodules, thickening, tenderness, bulging, retraction, inflamation, nipple discharge or skin changes noted.   Lungs:  Normal respiratory effort, chest expands symmetrically. Lungs are clear to auscultation, no crackles or wheezes. Heart:  Normal rate and regular rhythm. S1 and S2 normal without gallop, murmur, click, rub or other extra sounds. Abdomen:  Bowel sounds positive,abdomen soft and non-tender without masses, organomegaly or hernias noted. Rectal:  no external abnormalities.   Genitalia:  Pelvic Exam:        External: normal female genitalia without lesions or masses        Vagina: normal without lesions or masses        Cervix: normal without lesions or masses        Adnexa: normal bimanual exam without masses or fullness        Uterus: normal by palpation        Pap smear: performed Msk:  No deformity or scoliosis noted of thoracic or lumbar spine.   Extremities:  No clubbing, cyanosis, edema, or deformity noted with normal full range of motion of all joints.   Neurologic:  alert & oriented X3 and gait normal.   Skin:  Intact without suspicious lesions or rashes Cervical Nodes:  No lymphadenopathy noted Axillary Nodes:  No palpable lymphadenopathy Psych:  Cognition and judgment appear intact. Alert and cooperative with normal attention span and concentration. No apparent delusions, illusions,  hallucinations      Assessment & Plan:   Routine general medical examination at a health care facility  Encounter for routine gynecological examination  HYPERTRIGLYCERIDEMIA - Plan: Lipid panel  HYPERTENSION  Other screening mammogram - Plan: MM Digital Screening No Follow-up on file.

## 2013-12-30 NOTE — Assessment & Plan Note (Signed)
Reviewed preventive care protocols, scheduled due services, and updated immunizations Discussed nutrition, exercise, diet, and healthy lifestyle.  Mammogram ordered.

## 2013-12-30 NOTE — Assessment & Plan Note (Signed)
Pap smear today.

## 2013-12-30 NOTE — Assessment & Plan Note (Signed)
Deteriorated. She will work on diet- stop those protein shakes. Return in 3 months

## 2013-12-30 NOTE — Progress Notes (Signed)
Pre visit review using our clinic review tool, if applicable. No additional management support is needed unless otherwise documented below in the visit note. 

## 2013-12-31 ENCOUNTER — Telehealth: Payer: Self-pay | Admitting: Family Medicine

## 2013-12-31 NOTE — Telephone Encounter (Signed)
Relevant patient education assigned to patient using Emmi. ° °

## 2014-01-03 ENCOUNTER — Encounter: Payer: Self-pay | Admitting: Family Medicine

## 2014-02-21 ENCOUNTER — Encounter: Payer: Self-pay | Admitting: Internal Medicine

## 2014-02-21 ENCOUNTER — Ambulatory Visit (INDEPENDENT_AMBULATORY_CARE_PROVIDER_SITE_OTHER): Payer: BC Managed Care – PPO | Admitting: Internal Medicine

## 2014-02-21 VITALS — BP 124/86 | HR 82 | Temp 98.2°F | Wt 200.0 lb

## 2014-02-21 DIAGNOSIS — J069 Acute upper respiratory infection, unspecified: Secondary | ICD-10-CM

## 2014-02-21 MED ORDER — AZITHROMYCIN 250 MG PO TABS: ORAL_TABLET | ORAL | Status: DC

## 2014-02-21 NOTE — Patient Instructions (Addendum)
Upper Respiratory Infection, Adult An upper respiratory infection (URI) is also known as the common cold. It is often caused by a type of germ (virus). Colds are easily spread (contagious). You can pass it to others by kissing, coughing, sneezing, or drinking out of the same glass. Usually, you get better in 1 or 2 weeks.  HOME CARE   Only take medicine as told by your doctor.  Use a warm mist humidifier or breathe in steam from a hot shower.  Drink enough water and fluids to keep your pee (urine) clear or pale yellow.  Get plenty of rest.  Return to work when your temperature is back to normal or as told by your doctor. You may use a face mask and wash your hands to stop your cold from spreading. GET HELP RIGHT AWAY IF:   After the first few days, you feel you are getting worse.  You have questions about your medicine.  You have chills, shortness of breath, or brown or red spit (mucus).  You have yellow or brown snot (nasal discharge) or pain in the face, especially when you bend forward.  You have a fever, puffy (swollen) neck, pain when you swallow, or white spots in the back of your throat.  You have a bad headache, ear pain, sinus pain, or chest pain.  You have a high-pitched whistling sound when you breathe in and out (wheezing).  You have a lasting cough or cough up blood.  You have sore muscles or a stiff neck. MAKE SURE YOU:   Understand these instructions.  Will watch your condition.  Will get help right away if you are not doing well or get worse. Document Released: 02/08/2008 Document Revised: 11/14/2011 Document Reviewed: 12/27/2010 [facility] Patient Information 2015 [place], [place]. This information is not intended to replace advice given to you by your health care provider. Make sure you discuss any questions you have with your health care provider.

## 2014-02-21 NOTE — Progress Notes (Signed)
HPI  Pt presents to the clinic today with c/o cough and chest congestion. She reports this started 5 days ago. She has also had some associated nasal congestion. The cough is productive of green mucous. She denies fever, chills or body aches. She has tried Robitussin, Mucinex and Tylenol Cold with only minimal relief. She does have a history of allergies and asthma. She has not had sick contacts that she is aware of.  Review of Systems      Past Medical History  Diagnosis Date  . Hypertension     No family history on file.  History   Social History  . Marital Status: Married    Spouse Name: N/A    Number of Children: N/A  . Years of Education: N/A   Occupational History  . Not on file.   Social History Main Topics  . Smoking status: Never Smoker   . Smokeless tobacco: Never Used  . Alcohol Use: No     Comment: glass of wine at night  . Drug Use: No  . Sexual Activity: Not on file   Other Topics Concern  . Not on file   Social History Narrative  . No narrative on file    Allergies  Allergen Reactions  . Codeine Nausea And Vomiting  . Vicodin [Hydrocodone-Acetaminophen] Hives     Constitutional: Positive headache, fatigue. Denies fever or abrupt weight changes.  HEENT:  Positive nasal congestion, sore throat. Denies eye redness, eye pain, pressure behind the eyes, facial pain, nasal congestion, ear pain, ringing in the ears, wax buildup, runny nose or bloody nose. Respiratory: Positive cough. Denies difficulty breathing or shortness of breath.  Cardiovascular: Denies chest pain, chest tightness, palpitations or swelling in the hands or feet.   No other specific complaints in a complete review of systems (except as listed in HPI above).  Objective:   BP 124/86  Pulse 82  Temp(Src) 98.2 F (36.8 C) (Oral)  Wt 200 lb (90.719 kg)  SpO2 98% Wt Readings from Last 3 Encounters:  02/21/14 200 lb (90.719 kg)  12/30/13 201 lb (91.173 kg)  08/19/13 199 lb 12 oz  (90.606 kg)     General: Appears her stated age, well developed, well nourished in NAD. HEENT: Head: normal shape and size, no sinus pressure; Eyes: sclera white, no icterus, conjunctiva pink, PERRLA and EOMs intact; Ears: Tm's gray and intact, normal light reflex; Nose: mucosa pink and moist, septum midline; Throat/Mouth: + PND. Teeth present, mucosa erythematous and moist, no exudate noted, no lesions or ulcerations noted.  Neck: Mild cervical lymphadenopathy. Neck supple, trachea midline. No massses, lumps or thyromegaly present.  Cardiovascular: Normal rate and rhythm. S1,S2 noted.  No murmur, rubs or gallops noted. No JVD or BLE edema. No carotid bruits noted. Pulmonary/Chest: Normal effort and positive vesicular breath sounds. No respiratory distress. No wheezes, rales or ronchi noted.      Assessment & Plan:   Upper Respiratory Infection:  Get some rest and drink plenty of water Do salt water gargles for the sore throat eRx for Azithromax x 5 days Delsym OTC for cough  RTC as needed or if symptoms persist.

## 2014-02-21 NOTE — Progress Notes (Signed)
Pre visit review using our clinic review tool, if applicable. No additional management support is needed unless otherwise documented below in the visit note. 

## 2014-03-31 ENCOUNTER — Ambulatory Visit: Payer: BC Managed Care – PPO

## 2014-03-31 ENCOUNTER — Other Ambulatory Visit (INDEPENDENT_AMBULATORY_CARE_PROVIDER_SITE_OTHER): Payer: BC Managed Care – PPO

## 2014-03-31 DIAGNOSIS — R5383 Other fatigue: Secondary | ICD-10-CM

## 2014-03-31 DIAGNOSIS — E781 Pure hyperglyceridemia: Secondary | ICD-10-CM

## 2014-03-31 DIAGNOSIS — R5381 Other malaise: Secondary | ICD-10-CM

## 2014-03-31 LAB — LIPID PANEL
CHOLESTEROL: 249 mg/dL — AB (ref 0–200)
HDL: 42.2 mg/dL (ref >39.00)
NonHDL: 206.8
Total CHOL/HDL Ratio: 6
Triglycerides: 314 mg/dL — ABNORMAL HIGH (ref 0.0–149.0)
VLDL: 62.8 mg/dL — AB (ref 0.0–40.0)

## 2014-03-31 LAB — T4, FREE: Free T4: 0.96 ng/dL (ref 0.60–1.60)

## 2014-03-31 LAB — LDL CHOLESTEROL, DIRECT: Direct LDL: 81.8 mg/dL

## 2014-03-31 LAB — TSH: TSH: 0.78 u[IU]/mL (ref 0.35–4.50)

## 2014-07-15 ENCOUNTER — Other Ambulatory Visit: Payer: Self-pay | Admitting: Family Medicine

## 2014-08-21 ENCOUNTER — Other Ambulatory Visit: Payer: Self-pay | Admitting: Family Medicine

## 2014-10-09 ENCOUNTER — Encounter: Payer: Self-pay | Admitting: Family Medicine

## 2014-10-09 ENCOUNTER — Ambulatory Visit (INDEPENDENT_AMBULATORY_CARE_PROVIDER_SITE_OTHER): Payer: BC Managed Care – PPO | Admitting: Family Medicine

## 2014-10-09 VITALS — BP 118/78 | HR 76 | Temp 98.0°F | Wt 202.5 lb

## 2014-10-09 DIAGNOSIS — I10 Essential (primary) hypertension: Secondary | ICD-10-CM

## 2014-10-09 DIAGNOSIS — E781 Pure hyperglyceridemia: Secondary | ICD-10-CM

## 2014-10-09 LAB — LDL CHOLESTEROL, DIRECT: LDL DIRECT: 77 mg/dL

## 2014-10-09 LAB — LIPID PANEL
Cholesterol: 252 mg/dL — ABNORMAL HIGH (ref 0–200)
HDL: 39.5 mg/dL (ref >39.00)
NonHDL: 212.5
TRIGLYCERIDES: 362 mg/dL — AB (ref 0.0–149.0)
Total CHOL/HDL Ratio: 6
VLDL: 72.4 mg/dL — ABNORMAL HIGH (ref 0.0–40.0)

## 2014-10-09 LAB — COMPREHENSIVE METABOLIC PANEL
ALBUMIN: 4.3 g/dL (ref 3.5–5.2)
ALT: 19 U/L (ref 0–35)
AST: 19 U/L (ref 0–37)
Alkaline Phosphatase: 74 U/L (ref 39–117)
BILIRUBIN TOTAL: 0.8 mg/dL (ref 0.2–1.2)
BUN: 13 mg/dL (ref 6–23)
CO2: 25 meq/L (ref 19–32)
CREATININE: 0.81 mg/dL (ref 0.40–1.20)
Calcium: 9.5 mg/dL (ref 8.4–10.5)
Chloride: 103 mEq/L (ref 96–112)
GFR: 82.78 mL/min (ref >60.00)
Glucose, Bld: 88 mg/dL (ref 70–99)
POTASSIUM: 4.3 meq/L (ref 3.5–5.1)
Sodium: 136 mEq/L (ref 135–145)
TOTAL PROTEIN: 7.2 g/dL (ref 6.0–8.3)

## 2014-10-09 MED ORDER — BISOPROLOL FUMARATE 10 MG PO TABS: 10.0000 mg | ORAL_TABLET | Freq: Every day | ORAL | Status: DC

## 2014-10-09 NOTE — Assessment & Plan Note (Signed)
Due for labs- recheck today. No currently taking rx.

## 2014-10-09 NOTE — Progress Notes (Signed)
Subjective:   Patient ID: Alexis Hoffman, female    DOB: 1971-02-22, 44 y.o.   MRN: [id number]  Alexis Hoffman is a pleasant 44 y.o. year old female who presents to clinic today with Follow-up  on 10/09/2014  HPI: HTN- taking bisoprolol 10 mg daily since 2010.  Strong FH of HTN. Denies any dizziness, blurred vision, CP or SOB. Normotensive today. Lab Results  Component Value Date   CREATININE 0.8 12/23/2013   HLD- She does not eat out and does not eat fried foods.  Strong FH of HLD.  Never started taking Tricor that we prescribed.  Felt like she can just work on diet.  Has also had a lot of stressors with her autistic son- they are considering a group home.  Feels she is coping ok " I am used to it."  Lab Results  Component Value Date   CHOL 249* 03/31/2014   HDL 42.20 03/31/2014   LDLCALC 152* 12/23/2013   LDLDIRECT 81.8 03/31/2014   TRIG 314.0* 03/31/2014   CHOLHDL 6 03/31/2014   Current Outpatient Prescriptions on File Prior to Visit  Medication Sig Dispense Refill  . diphenhydrAMINE (BENADRYL) 25 MG tablet Take 25 mg by mouth at bedtime as needed for itching.     Current Facility-Administered Medications on File Prior to Visit  Medication Dose Route Frequency Provider Last Rate Last Dose  . penicillin G benzathine (BICILLIN-LA) 600000 UNIT/ML injection 600,000 Units  600,000 Units Intramuscular Once Lucille Passy, MD        Allergies  Allergen Reactions  . Codeine Nausea And Vomiting  . Vicodin [Hydrocodone-Acetaminophen] Hives    Past Medical History  Diagnosis Date  . Hypertension     Past Surgical History  Procedure Laterality Date  . Cholecystectomy    . Tubal ligation    . Cesarean section      History reviewed. No pertinent family history.  History   Social History  . Marital Status: Married    Spouse Name: N/A    Number of Children: N/A  . Years of Education: N/A   Occupational History  . Not on file.   Social History Main Topics  . Smoking  status: Never Smoker   . Smokeless tobacco: Never Used  . Alcohol Use: No     Comment: glass of wine at night  . Drug Use: No  . Sexual Activity: Not on file   Other Topics Concern  . Not on file   Social History Narrative   The PMH, PSH, Social History, Family History, Medications, and allergies have been reviewed in [facility], and have been updated if relevant.   Review of Systems  Constitutional: Negative.   HENT: Negative.   Eyes: Negative.   Respiratory: Negative.   Cardiovascular: Negative.   Gastrointestinal: Negative.   Musculoskeletal: Negative.   Skin: Negative.   Neurological: Negative.   Hematological: Negative.   Psychiatric/Behavioral: Negative.        Objective:    BP 118/78 mmHg  Pulse 76  Temp(Src) 98 F (36.7 C) (Oral)  Wt 202 lb 8 oz (91.853 kg)  SpO2 97%   Physical Exam  Constitutional: She is oriented to person, place, and time. She appears well-developed and well-nourished. No distress.  HENT:  Head: Normocephalic.  Eyes: Conjunctivae are normal.  Neck: Normal range of motion.  Cardiovascular: Normal rate.   Pulmonary/Chest: Effort normal.  Musculoskeletal: Normal range of motion.  Neurological: She is alert and oriented to person, place, and time. No cranial nerve  deficit.  Skin: Skin is warm and dry.  Psychiatric: She has a normal mood and affect. Her behavior is normal. Judgment and thought content normal.  Vitals reviewed.         Assessment & Plan:   Essential hypertension  HYPERTRIGLYCERIDEMIA No Follow-up on file.

## 2014-10-09 NOTE — Progress Notes (Signed)
Pre visit review using our clinic review tool, if applicable. No additional management support is needed unless otherwise documented below in the visit note. 

## 2014-10-09 NOTE — Patient Instructions (Signed)
It was great to see you. Hang in there.  We will call you with your lab results.

## 2014-10-09 NOTE — Assessment & Plan Note (Signed)
Normotensive. eRx refilled today. Labs as well.

## 2014-10-10 ENCOUNTER — Encounter: Payer: Self-pay | Admitting: Family Medicine

## 2014-12-24 ENCOUNTER — Encounter: Payer: Self-pay | Admitting: Family Medicine

## 2014-12-24 ENCOUNTER — Ambulatory Visit (INDEPENDENT_AMBULATORY_CARE_PROVIDER_SITE_OTHER): Payer: BC Managed Care – PPO | Admitting: Family Medicine

## 2014-12-24 VITALS — BP 116/70 | HR 66 | Temp 98.2°F | Wt 206.5 lb

## 2014-12-24 DIAGNOSIS — J069 Acute upper respiratory infection, unspecified: Secondary | ICD-10-CM | POA: Diagnosis not present

## 2014-12-24 MED ORDER — DEXAMETHASONE SODIUM PHOSPHATE 10 MG/ML IJ SOLN
10.0000 mg | Freq: Once | INTRAMUSCULAR | Status: AC
  Administered 2014-12-24: 10 mg via INTRAMUSCULAR

## 2014-12-24 MED ORDER — MONTELUKAST SODIUM 10 MG PO TABS: 10.0000 mg | ORAL_TABLET | Freq: Every day | ORAL | Status: DC

## 2014-12-24 NOTE — Patient Instructions (Signed)
Good to see you. Please keep me updated.

## 2014-12-24 NOTE — Addendum Note (Signed)
Addended by: Modena Nunnery on: 12/24/2014 09:09 AM   Modules accepted: Orders

## 2014-12-24 NOTE — Progress Notes (Signed)
Pre visit review using our clinic review tool, if applicable. No additional management support is needed unless otherwise documented below in the visit note. 

## 2014-12-24 NOTE — Progress Notes (Signed)
SUBJECTIVE:  Alexis Hoffman is a 44 y.o. female who complains of coryza, congestion, sneezing, sore throat, post nasal drip and dry cough for 14 days. She denies a history of anorexia, fevers and nausea and has a history of asthma but has not had to use an inhaler for over 20 years until these symptoms started. Patient does not smoke cigarettes.  Went to [employer] over the weekend.  Given tessalon perles and advised to take Allegra daily.   Current Outpatient Prescriptions on File Prior to Visit  Medication Sig Dispense Refill  . bisoprolol (ZEBETA) 10 MG tablet Take 1 tablet (10 mg total) by mouth daily. 90 tablet 6  . diphenhydrAMINE (BENADRYL) 25 MG tablet Take 25 mg by mouth at bedtime as needed for itching.     Current Facility-Administered Medications on File Prior to Visit  Medication Dose Route Frequency Provider Last Rate Last Dose  . penicillin G benzathine (BICILLIN-LA) 600000 UNIT/ML injection 600,000 Units  600,000 Units Intramuscular Once Lucille Passy, MD        Allergies  Allergen Reactions  . Codeine Nausea And Vomiting  . Vicodin [Hydrocodone-Acetaminophen] Hives    Past Medical History  Diagnosis Date  . Hypertension     Past Surgical History  Procedure Laterality Date  . Cholecystectomy    . Tubal ligation    . Cesarean section      History reviewed. No pertinent family history.  History   Social History  . Marital Status: Married    Spouse Name: N/A  . Number of Children: N/A  . Years of Education: N/A   Occupational History  . Not on file.   Social History Main Topics  . Smoking status: Never Smoker   . Smokeless tobacco: Never Used  . Alcohol Use: No     Comment: glass of wine at night  . Drug Use: No  . Sexual Activity: Not on file   Other Topics Concern  . Not on file   Social History Narrative   The PMH, PSH, Social History, Family History, Medications, and allergies have been reviewed in [facility], and have been updated if  relevant.  OBJECTIVE: BP 116/70 mmHg  Pulse 66  Temp(Src) 98.2 F (36.8 C) (Oral)  Wt 206 lb 8 oz (93.668 kg)  SpO2 97%  She appears well, vital signs are as noted. Ears normal.  Throat and pharynx normal.  Neck supple. No adenopathy in the neck. Nose is congested. Sinuses non tender. The chest is clear, without wheezes or rales.  ASSESSMENT:  viral upper respiratory illness and bronchospasm  PLAN: Singulair eRx sent.  Decadron IM to help with inflammation, continue prn albuterol. Symptomatic therapy suggested: push fluids, rest and return office visit prn if symptoms persist or worsen. Lack of antibiotic effectiveness discussed with her. Call or return to clinic prn if these symptoms worsen or fail to improve as anticipated.

## 2014-12-26 ENCOUNTER — Encounter: Payer: Self-pay | Admitting: Internal Medicine

## 2014-12-26 ENCOUNTER — Ambulatory Visit (INDEPENDENT_AMBULATORY_CARE_PROVIDER_SITE_OTHER): Payer: BC Managed Care – PPO | Admitting: Internal Medicine

## 2014-12-26 VITALS — BP 116/74 | HR 63 | Temp 98.1°F | Wt 202.0 lb

## 2014-12-26 DIAGNOSIS — M940 Chondrocostal junction syndrome [Tietze]: Secondary | ICD-10-CM | POA: Diagnosis not present

## 2014-12-26 MED ORDER — NAPROXEN 500 MG PO TABS: 500.0000 mg | ORAL_TABLET | Freq: Three times a day (TID) | ORAL | Status: DC

## 2014-12-26 NOTE — Progress Notes (Signed)
Subjective:    Patient ID: Alexis Hoffman, female    DOB: 1970/12/07, 44 y.o.   MRN: [id number]  HPI  Pt presents to the clinic today with c/o in her right ribs, underneath her right breast. This has been going on 3-4 days. She did have URI symptoms and has been coughing vigorously for the last week. Dr. Deborra Medina did give her a Decadron injection on 4/20, but reports she has not had any relief. Her URI symptoms have resolved but not the pain in her chest. It is worse with movement and deep breaths. She reports the pain is sharp and stabbing. She denies shortness of breath. She has not tried anything OTC.  Review of Systems      Past Medical History  Diagnosis Date  . Hypertension     Current Outpatient Prescriptions  Medication Sig Dispense Refill  . albuterol (PROVENTIL HFA;VENTOLIN HFA) 108 (90 BASE) MCG/ACT inhaler Inhale 2 puffs into the lungs every 4 (four) hours.    . bisoprolol (ZEBETA) 10 MG tablet Take 1 tablet (10 mg total) by mouth daily. 90 tablet 6  . diphenhydrAMINE (BENADRYL) 25 MG tablet Take 25 mg by mouth at bedtime as needed for itching.    . montelukast (SINGULAIR) 10 MG tablet Take 1 tablet (10 mg total) by mouth at bedtime. 30 tablet 3   No current facility-administered medications for this visit.   Facility-Administered Medications Ordered in Other Visits  Medication Dose Route Frequency Provider Last Rate Last Dose  . penicillin G benzathine (BICILLIN-LA) 600000 UNIT/ML injection 600,000 Units  600,000 Units Intramuscular Once Lucille Passy, MD        Allergies  Allergen Reactions  . Codeine Nausea And Vomiting  . Vicodin [Hydrocodone-Acetaminophen] Hives    No family history on file.  History   Social History  . Marital Status: Married    Spouse Name: N/A  . Number of Children: N/A  . Years of Education: N/A   Occupational History  . Not on file.   Social History Main Topics  . Smoking status: Never Smoker   . Smokeless tobacco: Never Used    . Alcohol Use: No     Comment: glass of wine at night  . Drug Use: No  . Sexual Activity: Not on file   Other Topics Concern  . Not on file   Social History Narrative     Constitutional: Denies fever, malaise, fatigue, headache or abrupt weight changes.  Respiratory: Denies difficulty breathing, shortness of breath, cough or sputum production.   Cardiovascular: Denies chest pain, chest tightness, palpitations or swelling in the hands or feet.  Musculoskeletal: Pt reports chest wall pain. Denies decrease in range of motion, difficulty with gait, or joint pain and swelling.    No other specific complaints in a complete review of systems (except as listed in HPI above).  Objective:   Physical Exam  BP 116/74 mmHg  Pulse 63  Temp(Src) 98.1 F (36.7 C) (Oral)  Wt 202 lb (91.627 kg)  SpO2 98% Wt Readings from Last 3 Encounters:  12/26/14 202 lb (91.627 kg)  12/24/14 206 lb 8 oz (93.668 kg)  10/09/14 202 lb 8 oz (91.853 kg)    General: Appears her stated age, obese in NAD. Skin: Warm, dry and intact. No rashes, lesions or ulcerations noted. Cardiovascular: Normal rate and rhythm. S1,S2 noted.  No murmur, rubs or gallops noted.  Pulmonary/Chest: Normal effort and positive vesicular breath sounds. No respiratory distress. No wheezes, rales or ronchi  noted.  Musculoskeletal: Pain reproduced by palpation of the intercostal space on the right side chest wall under the breast.   BMET    Component Value Date/Time   NA 136 10/09/2014 1058   K 4.3 10/09/2014 1058   CL 103 10/09/2014 1058   CO2 25 10/09/2014 1058   GLUCOSE 88 10/09/2014 1058   BUN 13 10/09/2014 1058   CREATININE 0.81 10/09/2014 1058   CALCIUM 9.5 10/09/2014 1058   GFRNONAA 98.57 06/15/2010 1102    Lipid Panel     Component Value Date/Time   CHOL 252* 10/09/2014 1058   TRIG 362.0* 10/09/2014 1058   HDL 39.50 10/09/2014 1058   CHOLHDL 6 10/09/2014 1058   VLDL 72.4* 10/09/2014 1058   LDLCALC 152*  12/23/2013 0949    CBC    Component Value Date/Time   WBC 5.2 12/23/2013 0949   RBC 4.61 12/23/2013 0949   HGB 14.3 12/23/2013 0949   HCT 42.0 12/23/2013 0949   PLT 216.0 12/23/2013 0949   MCV 91.2 12/23/2013 0949   MCHC 34.1 12/23/2013 0949   RDW 13.4 12/23/2013 0949   LYMPHSABS 1.6 12/23/2013 0949   MONOABS 0.4 12/23/2013 0949   EOSABS 0.2 12/23/2013 0949   BASOSABS 0.0 12/23/2013 0949    Hgb A1C No results found for: HGBA1C       Assessment & Plan:   Costochondritis:  eRx for Naproxen 500 mg TID prn Advised her to splint when she coughs Reassured her that this will resolve with time  RTC as needed or if symptoms persist or worsen

## 2014-12-26 NOTE — Progress Notes (Signed)
Pre visit review using our clinic review tool, if applicable. No additional management support is needed unless otherwise documented below in the visit note. 

## 2014-12-26 NOTE — Patient Instructions (Signed)
Costochondritis Costochondritis, sometimes called Tietze syndrome, is a swelling and irritation (inflammation) of the tissue (cartilage) that connects your ribs with your breastbone (sternum). It causes pain in the chest and rib area. Costochondritis usually goes away on its own over time. It can take up to 6 weeks or longer to get better, especially if you are unable to limit your activities. CAUSES  Some cases of costochondritis have no known cause. Possible causes include:  Injury (trauma).  Exercise or activity such as lifting.  Severe coughing. SIGNS AND SYMPTOMS  Pain and tenderness in the chest and rib area.  Pain that gets worse when coughing or taking deep breaths.  Pain that gets worse with specific movements. DIAGNOSIS  Your health care provider will do a physical exam and ask about your symptoms. Chest X-rays or other tests may be done to rule out other problems. TREATMENT  Costochondritis usually goes away on its own over time. Your health care provider may prescribe medicine to help relieve pain. HOME CARE INSTRUCTIONS   Avoid exhausting physical activity. Try not to strain your ribs during normal activity. This would include any activities using chest, abdominal, and side muscles, especially if heavy weights are used.  Apply ice to the affected area for the first 2 days after the pain begins.  Put ice in a plastic bag.  Place a towel between your skin and the bag.  Leave the ice on for 20 minutes, 2-3 times a day.  Only take over-the-counter or prescription medicines as directed by your health care provider. SEEK MEDICAL CARE IF:  You have redness or swelling at the rib joints. These are signs of infection.  Your pain does not go away despite rest or medicine. SEEK IMMEDIATE MEDICAL CARE IF:   Your pain increases or you are very uncomfortable.  You have shortness of breath or difficulty breathing.  You cough up blood.  You have worse chest pains,  sweating, or vomiting.  You have a fever or persistent symptoms for more than 2-3 days.  You have a fever and your symptoms suddenly get worse. MAKE SURE YOU:   Understand these instructions.  Will watch your condition.  Will get help right away if you are not doing well or get worse. Document Released: 06/01/2005 Document Revised: 06/12/2013 Document Reviewed: 03/26/2013 [facility] Patient Information 2015 [place], [place]. This information is not intended to replace advice given to you by your health care provider. Make sure you discuss any questions you have with your health care provider.

## 2014-12-29 ENCOUNTER — Ambulatory Visit: Payer: BC Managed Care – PPO | Admitting: Family Medicine

## 2015-01-06 ENCOUNTER — Encounter: Payer: Self-pay | Admitting: Family Medicine

## 2015-01-06 ENCOUNTER — Ambulatory Visit (INDEPENDENT_AMBULATORY_CARE_PROVIDER_SITE_OTHER): Payer: BC Managed Care – PPO | Admitting: Family Medicine

## 2015-01-06 VITALS — BP 138/88 | HR 82 | Temp 98.4°F | Wt 204.0 lb

## 2015-01-06 DIAGNOSIS — M940 Chondrocostal junction syndrome [Tietze]: Secondary | ICD-10-CM

## 2015-01-06 DIAGNOSIS — J069 Acute upper respiratory infection, unspecified: Secondary | ICD-10-CM

## 2015-01-06 MED ORDER — AZITHROMYCIN 250 MG PO TABS: ORAL_TABLET | ORAL | Status: DC

## 2015-01-06 MED ORDER — PREDNISONE 20 MG PO TABS: ORAL_TABLET | ORAL | Status: DC

## 2015-01-06 NOTE — Progress Notes (Signed)
Subjective:   Patient ID: Alexis Hoffman, female    DOB: [birth year], 44 y.o.   MRN: [id number]  Alexis Hoffman is a pleasant 44 y.o. year old female who presents to clinic today with Follow-up  on 01/06/2015  HPI:  Here because she is "not feeling any better."  I saw her on 4/20 for 2 weeks of worsening dry cough, congestion.  Note reviewed again. Had [facility] prior to seeing me and was advised to take [place].  I felt she likely had bronchospasm with allergic rhinitis and viral URI. Started singulair and given IM decadron to help with inflammation.  She returned to clinic on 4/22 (two days later) and saw Webb Silversmith for right sided chest pain. Note reviewed. At that time, URI symptoms were improving.  Rollene Fare felt she was suffering from costochondritis and eRx sent for Naproxen 500 mg three times daily prn and she has continued Singulair.  Cough has worsened again, now productive.  Still having right sided rib pain. Allergic to codeine.  She is SOB when she coughs.  Current Outpatient Prescriptions on File Prior to Visit  Medication Sig Dispense Refill  . albuterol (PROVENTIL HFA;VENTOLIN HFA) 108 (90 BASE) MCG/ACT inhaler Inhale 2 puffs into the lungs every 4 (four) hours.    . bisoprolol (ZEBETA) 10 MG tablet Take 1 tablet (10 mg total) by mouth daily. 90 tablet 6  . diphenhydrAMINE (BENADRYL) 25 MG tablet Take 25 mg by mouth at bedtime as needed for itching.    . montelukast (SINGULAIR) 10 MG tablet Take 1 tablet (10 mg total) by mouth at bedtime. 30 tablet 3  . naproxen (NAPROSYN) 500 MG tablet Take 1 tablet (500 mg total) by mouth 3 (three) times daily with meals. 90 tablet 0   Current Facility-Administered Medications on File Prior to Visit  Medication Dose Route Frequency Provider Last Rate Last Dose  . penicillin G benzathine (BICILLIN-LA) 600000 UNIT/ML injection 600,000 Units  600,000 Units Intramuscular Once Lucille Passy, MD        Allergies    Allergen Reactions  . Codeine Nausea And Vomiting  . Vicodin [Hydrocodone-Acetaminophen] Hives    Past Medical History  Diagnosis Date  . Hypertension     Past Surgical History  Procedure Laterality Date  . Cholecystectomy    . Tubal ligation    . Cesarean section      No family history on file.  History   Social History  . Marital Status: Married    Spouse Name: N/A  . Number of Children: N/A  . Years of Education: N/A   Occupational History  . Not on file.   Social History Main Topics  . Smoking status: Never Smoker   . Smokeless tobacco: Never Used  . Alcohol Use: No     Comment: glass of wine at night  . Drug Use: No  . Sexual Activity: Not on file   Other Topics Concern  . Not on file   Social History Narrative   The PMH, PSH, Social History, Family History, Medications, and allergies have been reviewed in [facility], and have been updated if relevant.   Review of Systems  Constitutional: Negative.   HENT: Positive for congestion, postnasal drip and rhinorrhea.   Respiratory: Positive for cough and shortness of breath. Negative for wheezing and stridor.   Cardiovascular: Negative.   Gastrointestinal: Negative.   Musculoskeletal: Negative.   Skin: Negative.   Allergic/Immunologic: Positive for environmental allergies.  Neurological: Negative.  All other systems reviewed and are negative.      Objective:    BP 138/88 mmHg  Pulse 82  Temp(Src) 98.4 F (36.9 C) (Oral)  Wt 204 lb (92.534 kg)  SpO2 97%   Physical Exam  Constitutional: She is oriented to person, place, and time. She appears well-developed and well-nourished. No distress.  HENT:  Right Ear: Hearing and tympanic membrane normal.  Left Ear: Hearing and tympanic membrane normal.  Nose: Mucosal edema and rhinorrhea present. No sinus tenderness. Right sinus exhibits no maxillary sinus tenderness and no frontal sinus tenderness. Left sinus exhibits no maxillary sinus tenderness and no  frontal sinus tenderness.  +PND signs  Pulmonary/Chest:  Unable to take deep breathes without coughing fits  Musculoskeletal: She exhibits no edema.  Neurological: She is alert and oriented to person, place, and time. No cranial nerve deficit.  Skin: Skin is warm and dry.  Psychiatric: She has a normal mood and affect. Her behavior is normal. Judgment and thought content normal.  Nursing note and vitals reviewed.         Assessment & Plan:   Acute upper respiratory infection  Costochondritis No Follow-up on file.

## 2015-01-06 NOTE — Patient Instructions (Signed)
Good to see you. Please take zpack and prednisone as directed. Please STOP taking Naproxen while you are taking prednisone as the combination can make you more prone to stomach bleeds. Please keep me updated.

## 2015-01-06 NOTE — Assessment & Plan Note (Signed)
Deteriorated. Explained to Alexis Hoffman that since I am unable to hear her breath sounds well, we should consider a CXR to rule out PNA.  Costochondritis can last for weeks to months but taking NSAIDs without relief and cough is now productive.  Given these signs, we agreed that I would treat her for presumed bronchitis/pneumonia and CXR at this time would not change my treatment plan.  We agreed to defer it at this time and start zpack with prednisone taper. Continue other rx. Call or return to clinic prn if these symptoms worsen or fail to improve as anticipated. The patient indicates understanding of these issues and agrees with the plan.

## 2015-01-06 NOTE — Assessment & Plan Note (Signed)
Persistent.  See above.

## 2015-01-06 NOTE — Progress Notes (Signed)
Pre visit review using our clinic review tool, if applicable. No additional management support is needed unless otherwise documented below in the visit note. 

## 2015-01-09 ENCOUNTER — Telehealth: Payer: Self-pay

## 2015-01-09 NOTE — Telephone Encounter (Signed)
Pt left v/m; pt was seen on 01/06/15; pt is still having pain in rib area but also entire rt side is hurting. Pt taking abx and prednisone. Pt coughed all last night. Pt request cb to see if anything else to be done or will it take more time.Please advise. Note sent to Dr Deborra Medina (out of office) and Webb Silversmith NP.

## 2015-01-09 NOTE — Telephone Encounter (Signed)
I would at least give it over the weekend. If it persist during that time, she can follow up Monday. If worse, ER.

## 2015-01-12 ENCOUNTER — Ambulatory Visit: Payer: BC Managed Care – PPO | Admitting: Family Medicine

## 2015-01-12 NOTE — Telephone Encounter (Signed)
Spoke to pt and informed her that per Dr Deborra Medina, to wait until she has completed both prescribed medications to see if there is any improvement. Informed pt that she may still be seen if wanting to; states she prefers to cancel. Advised pt that if she is not feeling any better by the end of the week to contact us back to be seen.

## 2015-10-16 ENCOUNTER — Other Ambulatory Visit: Payer: Self-pay | Admitting: Family Medicine

## 2016-01-07 ENCOUNTER — Other Ambulatory Visit: Payer: Self-pay

## 2016-01-07 MED ORDER — BISOPROLOL FUMARATE 10 MG PO TABS: 10.0000 mg | ORAL_TABLET | Freq: Every day | ORAL | Status: DC

## 2016-01-07 NOTE — Telephone Encounter (Signed)
Pt request refill bisoprolol to [facility]; pt took last pill today and has appt on 01/11/16 to see Dr Deborra Medina; pt request # 30 due to cost and pt will keep appt on 01/11/16 with Dr Deborra Medina. Pt last seen f/u on 01/06/15.refill done per protocol and pt voiced understanding.

## 2016-01-11 ENCOUNTER — Ambulatory Visit (INDEPENDENT_AMBULATORY_CARE_PROVIDER_SITE_OTHER): Payer: BC Managed Care – PPO | Admitting: Family Medicine

## 2016-01-11 ENCOUNTER — Encounter: Payer: Self-pay | Admitting: Family Medicine

## 2016-01-11 VITALS — BP 122/70 | HR 75 | Temp 97.5°F | Wt 206.8 lb

## 2016-01-11 DIAGNOSIS — I10 Essential (primary) hypertension: Secondary | ICD-10-CM | POA: Diagnosis not present

## 2016-01-11 LAB — COMPREHENSIVE METABOLIC PANEL
ALBUMIN: 4.3 g/dL (ref 3.5–5.2)
ALK PHOS: 66 U/L (ref 39–117)
ALT: 21 U/L (ref 0–35)
AST: 16 U/L (ref 0–37)
BUN: 11 mg/dL (ref 6–23)
CALCIUM: 9.3 mg/dL (ref 8.4–10.5)
CO2: 29 mEq/L (ref 19–32)
CREATININE: 0.7 mg/dL (ref 0.40–1.20)
Chloride: 103 mEq/L (ref 96–112)
GFR: 97.37 mL/min (ref >60.00)
Glucose, Bld: 90 mg/dL (ref 70–99)
Potassium: 4.3 mEq/L (ref 3.5–5.1)
Sodium: 137 mEq/L (ref 135–145)
Total Bilirubin: 0.7 mg/dL (ref 0.2–1.2)
Total Protein: 7 g/dL (ref 6.0–8.3)

## 2016-01-11 LAB — LIPID PANEL
CHOLESTEROL: 199 mg/dL (ref 0–200)
HDL: 43.6 mg/dL (ref >39.00)
LDL Cholesterol: 122 mg/dL — ABNORMAL HIGH (ref 0–99)
NonHDL: 155.25
Total CHOL/HDL Ratio: 5
Triglycerides: 167 mg/dL — ABNORMAL HIGH (ref 0.0–149.0)
VLDL: 33.4 mg/dL (ref 0.0–40.0)

## 2016-01-11 MED ORDER — BISOPROLOL FUMARATE 10 MG PO TABS: 10.0000 mg | ORAL_TABLET | Freq: Every day | ORAL | Status: DC

## 2016-01-11 NOTE — Progress Notes (Signed)
Pre visit review using our clinic review tool, if applicable. No additional management support is needed unless otherwise documented below in the visit note. 

## 2016-01-11 NOTE — Patient Instructions (Signed)
  Try over the counter nasocort-start with 2 sprays per nostril per day...and then try to taper to 1 spray per nostril once symptoms improve.

## 2016-01-11 NOTE — Progress Notes (Signed)
   Subjective:   Patient ID: Alexis Hoffman, female    DOB: [birth year], 45 y.o.   MRN: [id number]  Alexis Hoffman is a pleasant 45 y.o. year old female who presents to clinic today with Follow-up  on 01/11/2016  HPI: HTN- taking bisoprolol 10 mg daily since 2010.  Strong FH of HTN. Denies any dizziness, blurred vision, CP or SOB. Normotensive today. Lab Results  Component Value Date   CREATININE 0.81 10/09/2014     Lab Results  Component Value Date   CHOL 252* 10/09/2014   HDL 39.50 10/09/2014   LDLCALC 152* 12/23/2013   LDLDIRECT 77.0 10/09/2014   TRIG 362.0* 10/09/2014   CHOLHDL 6 10/09/2014   No current outpatient prescriptions on file prior to visit.   Current Facility-Administered Medications on File Prior to Visit  Medication Dose Route Frequency Provider Last Rate Last Dose  . penicillin G benzathine (BICILLIN-LA) 600000 UNIT/ML injection 600,000 Units  600,000 Units Intramuscular Once Lucille Passy, MD        Allergies  Allergen Reactions  . Codeine Nausea And Vomiting  . Vicodin [Hydrocodone-Acetaminophen] Hives    Past Medical History  Diagnosis Date  . Hypertension     Past Surgical History  Procedure Laterality Date  . Cholecystectomy    . Tubal ligation    . Cesarean section      No family history on file.  Social History   Social History  . Marital Status: Married    Spouse Name: N/A  . Number of Children: N/A  . Years of Education: N/A   Occupational History  . Not on file.   Social History Main Topics  . Smoking status: Never Smoker   . Smokeless tobacco: Never Used  . Alcohol Use: No     Comment: glass of wine at night  . Drug Use: No  . Sexual Activity: Not on file   Other Topics Concern  . Not on file   Social History Narrative   The PMH, PSH, Social History, Family History, Medications, and allergies have been reviewed in [facility], and have been updated if relevant.   Review of Systems  Constitutional: Negative.   HENT:  Negative.   Eyes: Negative.   Respiratory: Negative.   Cardiovascular: Negative.   Gastrointestinal: Negative.   Musculoskeletal: Negative.   Skin: Negative.   Neurological: Negative.   Hematological: Negative.   Psychiatric/Behavioral: Negative.        Objective:    BP 122/70 mmHg  Pulse 75  Temp(Src) 97.5 F (36.4 C) (Oral)  Wt 206 lb 12 oz (93.781 kg)  SpO2 99%   Physical Exam  Constitutional: She is oriented to person, place, and time. She appears well-developed and well-nourished. No distress.  HENT:  Head: Normocephalic.  Eyes: Conjunctivae are normal.  Neck: Normal range of motion.  Cardiovascular: Normal rate.   Pulmonary/Chest: Effort normal.  Musculoskeletal: Normal range of motion.  Neurological: She is alert and oriented to person, place, and time. No cranial nerve deficit.  Skin: Skin is warm and dry.  Psychiatric: She has a normal mood and affect. Her behavior is normal. Judgment and thought content normal.  Vitals reviewed.         Assessment & Plan:   Essential hypertension No Follow-up on file.

## 2016-01-11 NOTE — Assessment & Plan Note (Signed)
Well controlled.  No changes made to current rxs. Labs today.

## 2016-01-12 ENCOUNTER — Encounter: Payer: Self-pay | Admitting: Family Medicine

## 2016-01-12 ENCOUNTER — Ambulatory Visit (INDEPENDENT_AMBULATORY_CARE_PROVIDER_SITE_OTHER): Payer: BC Managed Care – PPO | Admitting: Family Medicine

## 2016-01-12 VITALS — BP 130/82 | HR 80 | Temp 97.8°F | Wt 206.5 lb

## 2016-01-12 DIAGNOSIS — R319 Hematuria, unspecified: Secondary | ICD-10-CM

## 2016-01-12 LAB — POC URINALSYSI DIPSTICK (AUTOMATED)
Bilirubin, UA: NEGATIVE
Glucose, UA: NEGATIVE
Ketones, UA: NEGATIVE
Nitrite, UA: NEGATIVE
PH UA: 7.5
Protein, UA: POSITIVE
SPEC GRAV UA: 1.02
Urobilinogen, UA: 0.2

## 2016-01-12 MED ORDER — CIPROFLOXACIN HCL 500 MG PO TABS: 500.0000 mg | ORAL_TABLET | Freq: Two times a day (BID) | ORAL | Status: DC

## 2016-01-12 NOTE — Addendum Note (Signed)
Addended by: Modena Nunnery on: 01/12/2016 09:50 AM   Modules accepted: Orders

## 2016-01-12 NOTE — Progress Notes (Signed)
SUBJECTIVE: Alexis Hoffman is a 45 y.o. female who complains of urinary frequency, hematuria, urgency and dysuria x 1 days without flank pain, fever, chills, or abnormal vaginal discharge or bleeding.   Current Outpatient Prescriptions on File Prior to Visit  Medication Sig Dispense Refill  . bisoprolol (ZEBETA) 10 MG tablet Take 1 tablet (10 mg total) by mouth daily. 90 tablet 3   Current Facility-Administered Medications on File Prior to Visit  Medication Dose Route Frequency Provider Last Rate Last Dose  . penicillin G benzathine (BICILLIN-LA) 600000 UNIT/ML injection 600,000 Units  600,000 Units Intramuscular Once Lucille Passy, MD        Allergies  Allergen Reactions  . Codeine Nausea And Vomiting  . Vicodin [Hydrocodone-Acetaminophen] Hives    Past Medical History  Diagnosis Date  . Hypertension     Past Surgical History  Procedure Laterality Date  . Cholecystectomy    . Tubal ligation    . Cesarean section      No family history on file.  Social History   Social History  . Marital Status: Married    Spouse Name: N/A  . Number of Children: N/A  . Years of Education: N/A   Occupational History  . Not on file.   Social History Main Topics  . Smoking status: Never Smoker   . Smokeless tobacco: Never Used  . Alcohol Use: No     Comment: glass of wine at night  . Drug Use: No  . Sexual Activity: Not on file   Other Topics Concern  . Not on file   Social History Narrative   The PMH, PSH, Social History, Family History, Medications, and allergies have been reviewed in [facility], and have been updated if relevant.  OBJECTIVE:  BP 130/82 mmHg  Pulse 80  Temp(Src) 97.8 F (36.6 C) (Oral)  Wt 206 lb 8 oz (93.668 kg)  SpO2 98%  Appears well, in no apparent distress.  Vital signs are normal. The abdomen is soft without tenderness, guarding, mass, rebound or organomegaly. No CVA tenderness or inguinal adenopathy noted. Urine dipstick shows positive for RBC's,  positive for protein and positive for leukocytes.   ASSESSMENT: UTI uncomplicated without evidence of pyelonephritis  PLAN: Treatment per orders - also push fluids, may use Pyridium OTC prn. Call or return to clinic prn if these symptoms worsen or fail to improve as anticipated.

## 2016-01-12 NOTE — Progress Notes (Signed)
Pre visit review using our clinic review tool, if applicable. No additional management support is needed unless otherwise documented below in the visit note. 

## 2016-01-12 NOTE — Patient Instructions (Signed)

## 2016-01-13 ENCOUNTER — Encounter: Payer: Self-pay | Admitting: *Deleted

## 2016-01-14 LAB — URINE CULTURE: Colony Count: >=100000

## 2016-08-09 ENCOUNTER — Ambulatory Visit (INDEPENDENT_AMBULATORY_CARE_PROVIDER_SITE_OTHER): Payer: BC Managed Care – PPO | Admitting: Family Medicine

## 2016-08-09 ENCOUNTER — Encounter: Payer: Self-pay | Admitting: Family Medicine

## 2016-08-09 VITALS — BP 122/84 | HR 80 | Temp 98.1°F | Wt 199.5 lb

## 2016-08-09 DIAGNOSIS — R300 Dysuria: Secondary | ICD-10-CM

## 2016-08-09 DIAGNOSIS — R350 Frequency of micturition: Secondary | ICD-10-CM | POA: Diagnosis not present

## 2016-08-09 DIAGNOSIS — Z23 Encounter for immunization: Secondary | ICD-10-CM | POA: Diagnosis not present

## 2016-08-09 LAB — POC URINALSYSI DIPSTICK (AUTOMATED)
Bilirubin, UA: NEGATIVE
GLUCOSE UA: NEGATIVE
KETONES UA: NEGATIVE
Nitrite, UA: NEGATIVE
Protein, UA: POSITIVE
SPEC GRAV UA: 1.015
Urobilinogen, UA: 0.2
pH, UA: 6.5

## 2016-08-09 MED ORDER — CIPROFLOXACIN HCL 500 MG PO TABS: 500.0000 mg | ORAL_TABLET | Freq: Two times a day (BID) | ORAL | 0 refills | Status: DC

## 2016-08-09 NOTE — Progress Notes (Signed)
SUBJECTIVE: Alexis Hoffman is a 45 y.o. female who complains of urinary frequency, urgency and dysuria x 7 days, without flank pain, fever, chills, or abnormal vaginal discharge or bleeding.   Current Outpatient Prescriptions on File Prior to Visit  Medication Sig Dispense Refill  . bisoprolol (ZEBETA) 10 MG tablet Take 1 tablet (10 mg total) by mouth daily. 90 tablet 3   Current Facility-Administered Medications on File Prior to Visit  Medication Dose Route Frequency Provider Last Rate Last Dose  . penicillin G benzathine (BICILLIN-LA) 600000 UNIT/ML injection 600,000 Units  600,000 Units Intramuscular Once Lucille Passy, MD        Allergies  Allergen Reactions  . Codeine Nausea And Vomiting  . Vicodin [Hydrocodone-Acetaminophen] Hives    Past Medical History:  Diagnosis Date  . Hypertension     Past Surgical History:  Procedure Laterality Date  . CESAREAN SECTION    . CHOLECYSTECTOMY    . TUBAL LIGATION      No family history on file.  Social History   Social History  . Marital status: Married    Spouse name: N/A  . Number of children: N/A  . Years of education: N/A   Occupational History  . Not on file.   Social History Main Topics  . Smoking status: Never Smoker  . Smokeless tobacco: Never Used  . Alcohol use No     Comment: glass of wine at night  . Drug use: No  . Sexual activity: Not on file   Other Topics Concern  . Not on file   Social History Narrative  . No narrative on file   The PMH, PSH, Social History, Family History, Medications, and allergies have been reviewed in [facility], and have been updated if relevant.  OBJECTIVE:  BP 122/84   Pulse 80   Temp 98.1 F (36.7 C) (Oral)   Wt 199 lb 8 oz (90.5 kg)   SpO2 99%   BMI 31.48 kg/m   Appears well, in no apparent distress.  Vital signs are normal. The abdomen is soft without tenderness, guarding, mass, rebound or organomegaly. No CVA tenderness or inguinal adenopathy noted. Urine dipstick shows  positive for RBC's, positive for protein and positive for leukocytes.   ASSESSMENT: UTI uncomplicated without evidence of pyelonephritis  PLAN: Treatment per orders - cipr 500 mg twice daily x 3 days, also push fluids, may use Pyridium OTC prn. Call or return to clinic prn if these symptoms worsen or fail to improve as anticipated.

## 2016-08-09 NOTE — Patient Instructions (Signed)
Acute Urinary Retention, Female Urinary retention means you are unable to pee completely or at all (empty your bladder). Follow these instructions at home:  Drink enough fluids to keep your pee (urine) clear or pale yellow.  If you are sent home with a tube that drains the bladder (catheter), there will be a drainage bag attached to it. There are two types of bags. One is big that you can wear at night without having to empty it. One is smaller and needs to be emptied more often.  Keep the drainage bag emptied.  Keep the drainage bag lower than the tube.  Only take medicine as told by your doctor. Contact a doctor if:  You have a low-grade fever.  You have spasms or you are leaking pee when you have spasms. Get help right away if:  You have chills or a fever.  Your catheter stops draining pee.  Your catheter falls out.  You have increased bleeding that does not stop after you have rested and increased the amount of fluids you had been drinking. This information is not intended to replace advice given to you by your health care provider. Make sure you discuss any questions you have with your health care provider. Document Released: 02/08/2008 Document Revised: 01/28/2016 Document Reviewed: 01/31/2013 Elsevier Interactive Patient Education  2017 [employer].

## 2016-08-09 NOTE — Progress Notes (Signed)
Pre visit review using our clinic review tool, if applicable. No additional management support is needed unless otherwise documented below in the visit note. 

## 2016-08-11 LAB — URINE CULTURE

## 2017-02-16 ENCOUNTER — Other Ambulatory Visit: Payer: Self-pay | Admitting: Family Medicine

## 2017-02-23 ENCOUNTER — Encounter: Payer: Self-pay | Admitting: Family Medicine

## 2017-02-23 ENCOUNTER — Ambulatory Visit (INDEPENDENT_AMBULATORY_CARE_PROVIDER_SITE_OTHER): Payer: BC Managed Care – PPO | Admitting: Family Medicine

## 2017-02-23 DIAGNOSIS — R1110 Vomiting, unspecified: Secondary | ICD-10-CM | POA: Insufficient documentation

## 2017-02-23 DIAGNOSIS — G43A0 Cyclical vomiting, not intractable: Secondary | ICD-10-CM | POA: Diagnosis not present

## 2017-02-23 DIAGNOSIS — R1115 Cyclical vomiting syndrome unrelated to migraine: Secondary | ICD-10-CM

## 2017-02-23 LAB — H. PYLORI ANTIBODY, IGG: H Pylori IgG: NEGATIVE

## 2017-02-23 LAB — COMPREHENSIVE METABOLIC PANEL
ALBUMIN: 4.3 g/dL (ref 3.5–5.2)
ALK PHOS: 61 U/L (ref 39–117)
ALT: 21 U/L (ref 0–35)
AST: 17 U/L (ref 0–37)
BUN: 12 mg/dL (ref 6–23)
CALCIUM: 9.6 mg/dL (ref 8.4–10.5)
CO2: 29 mEq/L (ref 19–32)
CREATININE: 0.77 mg/dL (ref 0.40–1.20)
Chloride: 103 mEq/L (ref 96–112)
GFR: 86.77 mL/min (ref >60.00)
Glucose, Bld: 86 mg/dL (ref 70–99)
POTASSIUM: 4.7 meq/L (ref 3.5–5.1)
SODIUM: 139 meq/L (ref 135–145)
TOTAL PROTEIN: 6.6 g/dL (ref 6.0–8.3)
Total Bilirubin: 0.8 mg/dL (ref 0.2–1.2)

## 2017-02-23 LAB — HCG, QUANTITATIVE, PREGNANCY: Quantitative HCG: 0.24 m[IU]/mL

## 2017-02-23 LAB — LUTEINIZING HORMONE: LH: 55.14 m[IU]/mL

## 2017-02-23 LAB — HEMOGLOBIN A1C: Hgb A1c MFr Bld: 5.7 % (ref 4.6–6.5)

## 2017-02-23 LAB — TSH: TSH: 0.77 u[IU]/mL (ref 0.35–4.50)

## 2017-02-23 LAB — FOLLICLE STIMULATING HORMONE: FSH: 19.8 m[IU]/mL

## 2017-02-23 LAB — LIPASE: Lipase: 22 U/L (ref 11.0–59.0)

## 2017-02-23 MED ORDER — ONDANSETRON HCL 4 MG PO TABS: 4.0000 mg | ORAL_TABLET | Freq: Three times a day (TID) | ORAL | 1 refills | Status: DC | PRN

## 2017-02-23 NOTE — Progress Notes (Signed)
Subjective:   Patient ID: Alexis Hoffman, female    DOB: 1970-10-09, 46 y.o.   MRN: [id number]  Alexis Hoffman is a pleasant 46 y.o. year old female who presents to clinic today with Nausea (off and on for a month ) and Emesis  on 02/23/2017  HPI:  Nausea and vomiting- started one morning about a month ago.  She just thought she had a virus.  Since then, almost daily morning emesis.  Does not wake her up from sleep and she feels "fine" the rest of the day. No diarrhea.  No abdominal pain.  Has not had a period in 9 years- remote h/o BTL and ablation.  Nothing seems to make it better or worse.  Similar symptoms during all of her pregnancies.  Has taken two HPT- both neg. Current Outpatient Prescriptions on File Prior to Visit  Medication Sig Dispense Refill  . bisoprolol (ZEBETA) 10 MG tablet TAKE 1 TABLET BY MOUTH DAILY 30 tablet 0   Current Facility-Administered Medications on File Prior to Visit  Medication Dose Route Frequency Provider Last Rate Last Dose  . penicillin G benzathine (BICILLIN-LA) 600000 UNIT/ML injection 600,000 Units  600,000 Units Intramuscular Once Lucille Passy, MD        Allergies  Allergen Reactions  . Codeine Nausea And Vomiting  . Vicodin [Hydrocodone-Acetaminophen] Hives    Past Medical History:  Diagnosis Date  . Hypertension     Past Surgical History:  Procedure Laterality Date  . CESAREAN SECTION    . CHOLECYSTECTOMY    . TUBAL LIGATION      No family history on file.  Social History   Social History  . Marital status: Married    Spouse name: N/A  . Number of children: N/A  . Years of education: N/A   Occupational History  . Not on file.   Social History Main Topics  . Smoking status: Never Smoker  . Smokeless tobacco: Never Used  . Alcohol use No     Comment: glass of wine at night  . Drug use: No  . Sexual activity: Not on file   Other Topics Concern  . Not on file   Social History Narrative  . No narrative on  file   The PMH, PSH, Social History, Family History, Medications, and allergies have been reviewed in [facility], and have been updated if relevant.  Review of Systems  Constitutional: Negative.   Gastrointestinal: Positive for nausea and vomiting. Negative for abdominal distention, abdominal pain, anal bleeding, blood in stool, constipation, diarrhea and rectal pain.  Musculoskeletal: Negative.   Psychiatric/Behavioral: Negative.   All other systems reviewed and are negative.      Objective:    BP 120/80   Pulse (!) 52   Temp 97.8 F (36.6 C)   Wt 200 lb 8 oz (90.9 kg)   SpO2 100%   BMI 31.64 kg/m   Wt Readings from Last 3 Encounters:  02/23/17 200 lb 8 oz (90.9 kg)  08/09/16 199 lb 8 oz (90.5 kg)  01/12/16 206 lb 8 oz (93.7 kg)    Physical Exam  Constitutional: She is oriented to person, place, and time. She appears well-developed and well-nourished. No distress.  HENT:  Head: Normocephalic and atraumatic.  Eyes: Conjunctivae are normal.  Cardiovascular: Normal rate.   Pulmonary/Chest: Effort normal.  Abdominal: Soft. Bowel sounds are normal. She exhibits no distension and no mass. There is no tenderness. There is no rebound and no guarding.  Musculoskeletal: Normal range of  motion. She exhibits no edema.  Neurological: She is alert and oriented to person, place, and time. No cranial nerve deficit.  Skin: Skin is warm and dry. She is not diaphoretic.  Psychiatric: She has a normal mood and affect. Her behavior is normal. Judgment and thought content normal.  Nursing note and vitals reviewed.         Assessment & Plan:   Non-intractable cyclical vomiting with nausea No Follow-up on file.

## 2017-02-23 NOTE — Assessment & Plan Note (Signed)
Unclear etiology- seems cyclical in nature. Start out with labs today, may need referral to GI if labs unremarkable given duration of symptoms. The patient indicates understanding of these issues and agrees with the plan. Orders Placed This Encounter  Procedures  . hCG, quantitative, pregnancy  . Hemoglobin A1c  . H. pylori antibody, IgG  . Comprehensive metabolic panel  . Lipase  . TSH  . Luteinizing hormone  . Follicle Stimulating Hormone

## 2017-03-07 ENCOUNTER — Encounter: Payer: Self-pay | Admitting: Family Medicine

## 2017-03-08 ENCOUNTER — Other Ambulatory Visit: Payer: Self-pay | Admitting: Family Medicine

## 2017-03-08 DIAGNOSIS — R112 Nausea with vomiting, unspecified: Secondary | ICD-10-CM

## 2017-03-13 ENCOUNTER — Encounter: Payer: Self-pay | Admitting: Family Medicine

## 2017-03-21 ENCOUNTER — Encounter: Payer: Self-pay | Admitting: Gastroenterology

## 2017-03-24 ENCOUNTER — Ambulatory Visit (INDEPENDENT_AMBULATORY_CARE_PROVIDER_SITE_OTHER): Payer: BC Managed Care – PPO | Admitting: Certified Nurse Midwife

## 2017-03-24 ENCOUNTER — Encounter: Payer: Self-pay | Admitting: Certified Nurse Midwife

## 2017-03-24 VITALS — BP 134/73 | HR 55 | Ht 67.0 in | Wt 199.1 lb

## 2017-03-24 DIAGNOSIS — R350 Frequency of micturition: Secondary | ICD-10-CM | POA: Diagnosis not present

## 2017-03-24 DIAGNOSIS — R112 Nausea with vomiting, unspecified: Secondary | ICD-10-CM

## 2017-03-24 DIAGNOSIS — R140 Abdominal distension (gaseous): Secondary | ICD-10-CM

## 2017-03-24 DIAGNOSIS — R141 Gas pain: Secondary | ICD-10-CM | POA: Diagnosis not present

## 2017-03-24 DIAGNOSIS — M545 Low back pain: Secondary | ICD-10-CM | POA: Diagnosis not present

## 2017-03-24 NOTE — Patient Instructions (Addendum)
Morning Sickness Morning sickness is when you feel sick to your stomach (nauseous) during pregnancy. This nauseous feeling may or may not come with vomiting. It often occurs in the morning but can be a problem any time of day. Morning sickness is most common during the first trimester, but it may continue throughout pregnancy. While morning sickness is unpleasant, it is usually harmless unless you develop severe and continual vomiting (hyperemesis gravidarum). This condition requires more intense treatment. What are the causes? The cause of morning sickness is not completely known but seems to be related to normal hormonal changes that occur in pregnancy. What increases the risk? You are at greater risk if you:  Experienced nausea or vomiting before your pregnancy.  Had morning sickness during a previous pregnancy.  Are pregnant with more than one baby, such as twins.  How is this treated? Do not use any medicines (prescription, over-the-counter, or herbal) for morning sickness without first talking to your health care provider. Your health care provider may prescribe or recommend:  Vitamin B6 supplements.  Anti-nausea medicines.  The herbal medicine ginger.  Follow these instructions at home:  Only take over-the-counter or prescription medicines as directed by your health care provider.  Taking multivitamins before getting pregnant can prevent or decrease the severity of morning sickness in most women.  Eat a piece of dry toast or unsalted crackers before getting out of bed in the morning.  Eat five or six small meals a day.  Eat dry and bland foods (rice, baked potato). Foods high in carbohydrates are often helpful.  Do not drink liquids with your meals. Drink liquids between meals.  Avoid greasy, fatty, and spicy foods.  Get someone to cook for you if the smell of any food causes nausea and vomiting.  If you feel nauseous after taking prenatal vitamins, take the vitamins at  night or with a snack.  Snack on protein foods (nuts, yogurt, cheese) between meals if you are hungry.  Eat unsweetened gelatins for desserts.  Wearing an acupressure wristband (worn for sea sickness) may be helpful.  Acupuncture may be helpful.  Do not smoke.  Get a humidifier to keep the air in your house free of odors.  Get plenty of fresh air. Contact a health care provider if:  Your home remedies are not working, and you need medicine. You feel dizzy or lightheaded.  Common Medications Safe in Pregnancy  Acne:      Constipation:  Benzoyl Peroxide     Colace  Clindamycin      Dulcolax Suppository  Topica Erythromycin     Fibercon  Salicylic Acid      Metamucil         Miralax AVOID:        Senakot   Accutane    Cough:  Retin-A       Cough Drops  Tetracycline      Phenergan w/ Codeine if Rx  Minocycline      Robitussin (Plain & DM)  Antibiotics:     Crabs/Lice:  Ceclor       RID  Cephalosporins    AVOID:  E-Mycins      Kwell  Keflex  Macrobid/Macrodantin   Diarrhea:  Penicillin      Kao-Pectate  Zithromax      Imodium AD         PUSH FLUIDS AVOID:       Cipro     Fever:  Tetracycline      Tylenol (Regular or  Extra  Minocycline       Strength)  Levaquin      Extra Strength-Do not          Exceed 8 tabs/24 hrs Caffeine:        <267m/day (equiv. To 1 cup of coffee or  approx. 3 12 oz sodas)         Gas: Cold/Hayfever:       Gas-X  Benadryl      Mylicon  Claritin       Phazyme  **Claritin-D        Chlor-Trimeton    Headaches:  Dimetapp      ASA-Free Excedrin  Drixoral-Non-Drowsy     Cold Compress  Mucinex (Guaifenasin)     Tylenol (Regular or Extra  Sudafed/Sudafed-12 Hour     Strength)  **Sudafed PE Pseudoephedrine   Tylenol Cold & Sinus     Vicks Vapor Rub  Zyrtec  **AVOID if Problems With Blood Pressure         Heartburn: Avoid lying down for at least 1 hour after meals  Aciphex      Maalox     Rash:  Milk of  Magnesia     Benadryl    Mylanta       1% Hydrocortisone Cream  Pepcid  Pepcid Complete   Sleep Aids:  Prevacid      Ambien   Prilosec       Benadryl  Rolaids       Chamomile Tea  Tums (Limit 4/day)     Unisom  Zantac       Tylenol PM         Warm milk-add vanilla or  Hemorrhoids:       Sugar for taste  Anusol/Anusol H.C.  (RX: Analapram 2.5%)  Sugar Substitutes:  Hydrocortisone OTC     Ok in moderation  Preparation H      Tucks        Vaseline lotion applied to tissue with wiping    Herpes:     Throat:  Acyclovir      Oragel  Famvir  Valtrex     Vaccines:         Flu Shot Leg Cramps:       *Gardasil  Benadryl      Hepatitis A         Hepatitis B Nasal Spray:       Pneumovax  Saline Nasal Spray     Polio Booster         Tetanus Nausea:       Tuberculosis test or PPD  Vitamin B6 25 mg TID   AVOID:    Dramamine      *Gardasil  Emetrol       Live Poliovirus  Ginger Root 250 mg QID    MMR (measles, mumps &  High Complex Carbs @ Bedtime    rebella)  Sea Bands-Accupressure    Varicella (Chickenpox)  Unisom 1/2 tab TID     *No known complications           If received before Pain:         Known pregnancy;   Darvocet       Resume series after  Lortab        Delivery  Percocet    Yeast:   Tramadol      Femstat  Tylenol 3      Gyne-lotrimin  Ultram       Monistat  Vicodin  MISC:         All Sunscreens           Hair Coloring/highlights          Insect Repellant's          (Including DEET)         Mystic Tans    You are losing weight. Get help right away if:  You have persistent and uncontrolled nausea and vomiting.  You pass out (faint). This information is not intended to replace advice given to you by your health care provider. Make sure you discuss any questions you have with your health care provider. Document Released: 10/13/2006 Document Revised: 01/28/2016 Document Reviewed: 02/06/2013 Elsevier Interactive Patient Education  2017 [employer] Pregnancy A molar pregnancy (hydatidiform mole) is a mass of tissue that grows in the uterus after conception. The mass is created by an egg that was not fertilized correctly and abnormally grows. It is an abnormal pregnancy and does not develop into a fetus. If a molar pregnancy is suspected by your health care provider, treatment is required. What are the causes? Molar pregnancy is caused by an egg that is fertilized incorrectly so that it has abnormal genetic material (chromosomes). This can result in one of 2 types of molar pregnancy:  Complete molar pregnancy-All of the chromosomes in the fertilized egg come from the father; none come from the mother.  Partial molar pregnancy-The fertilized egg has chromosomes from the father and mother, but it has too many chromosomes.  What increases the risk? Certain risk factors make a molar pregnancy more likely. They include:  Being over age 21 or under age 59.  History of a molar pregnancy in the past (extremely small chance of recurrence).  Other possible risk factors include:  Smoking more than 15 cigarettes per day.  History of infertility.  Having a certain blood type (A, B, AB).  Having a vitamin A deficiency.  Using oral contraceptives.  What are the signs or symptoms?  Vaginal bleeding.  Missed menstrual period.  Uterus grows quicker than normal.  Severe nausea and vomiting.  Severe pressure or pain in the uterus.  Abnormal ovarian cysts (theca lutein cysts).  Discharge from the vagina that looks like grapes.  High blood pressure (early onset of preeclampsia).  Overactive thyroid (hyperthyroidism).  Anemia. How is this diagnosed? If your health care provider thinks there is a chance of a molar pregnancy, testing will be recommended. Possible tests include:  An ultrasound test.  Blood tests.  How is this treated? Most molar pregnancies end on their own by miscarriage. However, a health care  provider needs to make sure that all the abnormal tissue is out of the womb. This can be done with dilation and curettage (D&C) or suction curettage. In this procedure, any remaining molar tissue is removed through the vagina. After diagnosis of a molar pregnancy, the pregnancy hormone levels must be followed until the level is zero. If the pregnancy hormone level does not drop appropriately, chemotherapy may be necessary. Also, you will be given a medicine called Rho (D) immune globulin if you are Rh negative and your sex partner is Rh positive. This helps prevent Rh problems in future pregnancies. Follow these instructions at home:  Avoid getting pregnant for 6-12 months or as directed by your health care provider. Use a reliable form of birth control or do not have sex.  Only take over-the-counter or prescription medicine as directed by your health care provider.  Keep all follow-up appointments and get all suggested lab tests and ultrasound tests.  Gradually return to normal activities.  Think about joining a support group. Ask for help if you are struggling with grief. This information is not intended to replace advice given to you by your health care provider. Make sure you discuss any questions you have with your health care provider. Document Released: 05/10/2011 Document Revised: 01/28/2016 Document Reviewed: 03/21/2013 Elsevier Interactive Patient Education  2017 [employer].

## 2017-03-31 NOTE — Progress Notes (Signed)
GYN ENCOUNTER NOTE  Subjective:       Alexis Hoffman is a 46 y.o. [phone] female here for evaluation of nausea and vomiting, urinary frequency, low back pain, abdominal distention, and gas pain since May 2018.   Alexis Hoffman "feels the same way she felt" with her last four (4) pregnancies, but know that she is not pregnant. She has abdominal distention and "feels flutters" like fetal movement.   Denies difficulty breathing or respiratory distress, chest pain, abdominal pain, vaginal bleeding, dysuria, change in vaginal discharge, and leg pain or swelling.    Gynecologic History  No LMP recorded. Patient has had an ablation.  Contraception: tubal ligation, 11 years ago.   Last Pap: 12/2013. Results were: normal; negative.   Obstetric History  OB History  Gravida Para Term Preterm AB Living  4 4 3 1   4   SAB TAB Ectopic Multiple Live Births          4    # Outcome Date GA Lbr Len/2nd Weight Sex Delivery Anes PTL Lv  4 Term 2007 [redacted]w[redacted]d  M Vag-Spont   LIV  3 Term 2002 [id number] lb 9 oz (3.884 kg) F Vag-Spont   LIV  2 Term 2000 [id number] CS-LTranv   LIV  1 Preterm 1999 [id number] 12 oz (3.969 kg) M    LIV      Past Medical History:  Diagnosis Date  . Hypertension     Past Surgical History:  Procedure Laterality Date  . ABLATION    . CESAREAN SECTION    . CHOLECYSTECTOMY    . TUBAL LIGATION      Current Outpatient Prescriptions on File Prior to Visit  Medication Sig Dispense Refill  . bisoprolol (ZEBETA) 10 MG tablet TAKE 1 TABLET BY MOUTH DAILY 30 tablet 0  . ondansetron (ZOFRAN) 4 MG tablet Take 1 tablet (4 mg total) by mouth every 8 (eight) hours as needed for nausea or vomiting. 30 tablet 1   Current Facility-Administered Medications on File Prior to Visit  Medication Dose Route Frequency Provider Last Rate Last Dose  . penicillin G benzathine (BICILLIN-LA) 600000 UNIT/ML injection 600,000 Units  600,000 Units Intramuscular Once AronLucille Passy        Allergies   Allergen Reactions  . Codeine Nausea And Vomiting  . Vicodin [Hydrocodone-Acetaminophen] Hives    Social History   Social History  . Marital status: Married    Spouse name: N/A  . Number of children: N/A  . Years of education: N/A   Occupational History  . Not on file.   Social History Main Topics  . Smoking status: Never Smoker  . Smokeless tobacco: Never Used  . Alcohol use No     Comment: glass of wine at night  . Drug use: No  . Sexual activity: Yes    Birth control/ protection: Surgical     Comment: Ablation    Other Topics Concern  . Not on file   Social History Narrative  . No narrative on file    Family History  Problem Relation Age of Onset  . Arthritis Mother   . COPD Father   . Hypertension Father     The following portions of the patient's history were reviewed and updated as appropriate: allergies, current medications, past family history, past medical history, past social history, past surgical history and problem list.  Review of Systems  Review of Systems - Negative except as noted above.  History obtained from the patient.   Objective:   BP 134/73 (BP Location: Left Arm, Patient Position: Sitting, Cuff Size: Normal)   Pulse (!) 55   Ht 5' 7"  (1.702 m)   Wt 199 lb 1.6 oz (90.3 kg)   BMI 31.18 kg/m   CONSTITUTIONAL: Well-developed, well-nourished female in no acute distress.   HENT:  Normocephalic, atraumatic.   NECK: Normal range of motion, supple, no masses.    SKIN: Skin is warm and dry. No rash noted. Not diaphoretic. No erythema. No pallor.  [place]: Alert and oriented to person, place, and time.   PSYCHIATRIC: Normal mood and affect. Normal behavior. Normal judgment and thought content.  MUSCULOSKELETAL: Normal range of motion. No tenderness.  No cyanosis, clubbing, or edema.  ABDOMEN: soft, obese, distended, non-tender  LABS:   Recent Results (from the past 2160 hour(s))  hCG, quantitative, pregnancy     Status: None    Collection Time: 02/23/17 12:59 PM  Result Value Ref Range   Quantitative HCG 0.24 mIU/ml    Comment: Non-Pregnant Females >25yrand <456yr.0-0.6(mIU/ml)Non-Pregnant Females >40102yr.0-3.1(mIU/ml)Non-Pregnant Females Post-Menopause0.1-11.6(mIU/ml)  Hemoglobin A1c     Status: None   Collection Time: 02/23/17 12:59 PM  Result Value Ref Range   Hgb A1c MFr Bld 5.7 4.6 - 6.5 %    Comment: Glycemic Control Guidelines for People with Diabetes:Non Diabetic:  <6%Goal of Therapy: <7%Additional Action Suggested:  >8%   H. pylori antibody, IgG     Status: None   Collection Time: 02/23/17 12:59 PM  Result Value Ref Range   H Pylori IgG Negative Negative  Comprehensive metabolic panel     Status: None   Collection Time: 02/23/17 12:59 PM  Result Value Ref Range   Sodium 139 135 - 145 mEq/L   Potassium 4.7 3.5 - 5.1 mEq/L   Chloride 103 96 - 112 mEq/L   CO2 29 19 - 32 mEq/L   Glucose, Bld 86 70 - 99 mg/dL   BUN 12 6 - 23 mg/dL   Creatinine, Ser 0.77 0.40 - 1.20 mg/dL   Total Bilirubin 0.8 0.2 - 1.2 mg/dL   Alkaline Phosphatase 61 39 - 117 U/L   AST 17 0 - 37 U/L   ALT 21 0 - 35 U/L   Total Protein 6.6 6.0 - 8.3 g/dL   Albumin 4.3 3.5 - 5.2 g/dL   Calcium 9.6 8.4 - 10.5 mg/dL   GFR 86.77 >60.00 mL/min  Lipase     Status: None   Collection Time: 02/23/17 12:59 PM  Result Value Ref Range   Lipase 22.0 11.0 - 59.0 U/L  TSH     Status: None   Collection Time: 02/23/17 12:59 PM  Result Value Ref Range   TSH 0.77 0.35 - 4.50 uIU/mL  Luteinizing hormone     Status: None   Collection Time: 02/23/17 12:59 PM  Result Value Ref Range   LH 55.14 mIU/mL    Comment: Female Reference Range:20-70 yrs     1.5-9.3 mIU/mL>70 yrs       3.1-35.6 mIU/mLFemale Reference Range:Follicular Phase     1.99.2-33.0U/mLMidcycle             8.7-76.3 mIU/mLLuteal Phase         0.5-16.9 mIU/mL  Post Menopausal      15.9-54.0  mIU/mLPregnant             <1.5 mIU/mLContraceptives       0.70.7-6.2U/mL   Follicle  Stimulating Hormone  Status: None   Collection Time: 02/23/17 12:59 PM  Result Value Ref Range   FSH 19.8 mIU/ML    Comment: Female Reference Range:  1.4-18.1 mIU/mLFemale Reference Range:Follicular Phase          2.5-10.2 mIU/mLMidCycle Peak          3.4-33.4 mIU/mLLuteal Phase          1.5-9.1 mIU/mLPost Menopausal     23.0-116.3 mIU/mLPregnant          <0.3 mIU/mL    Assessment:   1. Nausea and vomiting, intractability of vomiting not specified, unspecified vomiting type  - US Transvaginal Non-OB; Future - US Pelvis Complete; Future  2. Urinary frequency  - US Transvaginal Non-OB; Future - US Pelvis Complete; Future  3. Acute bilateral low back pain, with sciatica presence unspecified  - US Transvaginal Non-OB; Future - US Pelvis Complete; Future  4. Abdominal distention  - US Transvaginal Non-OB; Future - US Pelvis Complete; Future  5. Gas pain  - US Transvaginal Non-OB; Future - US Pelvis Complete; Future   Plan:   Education regarding differential diagnoses including psuedocyesis.   RTC x 1-2 weeks for [place] and results review.   Reviewed red flag symptoms and when to call.    Diona Fanti, CNM  A total of 20 minutes were spent face-to-face with the patient during the encounter with greater than 50% dealing with counseling and coordination of care.

## 2017-04-03 ENCOUNTER — Ambulatory Visit (INDEPENDENT_AMBULATORY_CARE_PROVIDER_SITE_OTHER): Payer: BC Managed Care – PPO

## 2017-04-03 DIAGNOSIS — R112 Nausea with vomiting, unspecified: Secondary | ICD-10-CM | POA: Diagnosis not present

## 2017-04-03 DIAGNOSIS — M545 Low back pain: Secondary | ICD-10-CM

## 2017-04-03 DIAGNOSIS — R350 Frequency of micturition: Secondary | ICD-10-CM

## 2017-04-03 DIAGNOSIS — R140 Abdominal distension (gaseous): Secondary | ICD-10-CM

## 2017-04-03 DIAGNOSIS — R141 Gas pain: Secondary | ICD-10-CM

## 2017-04-05 ENCOUNTER — Encounter: Payer: Self-pay | Admitting: Obstetrics and Gynecology

## 2017-04-05 ENCOUNTER — Ambulatory Visit: Payer: Self-pay | Admitting: Obstetrics and Gynecology

## 2017-04-05 ENCOUNTER — Telehealth: Payer: Self-pay | Admitting: Certified Nurse Midwife

## 2017-04-05 NOTE — Telephone Encounter (Signed)
Pt was notified that her ultrasound results were normal. Pt had no further questions.

## 2017-04-05 NOTE — Telephone Encounter (Signed)
Patient lvm wanting to know the results from a pelvic u/s, I called the patient back and informed her that the u/s results are not in yet, but someone will most definitely contact her once the results have been reviewed. Please advise.

## 2017-04-10 ENCOUNTER — Other Ambulatory Visit: Payer: Self-pay | Admitting: Family Medicine

## 2017-04-10 MED ORDER — BISOPROLOL FUMARATE 10 MG PO TABS: 10.0000 mg | ORAL_TABLET | Freq: Every day | ORAL | 0 refills | Status: DC

## 2017-04-19 ENCOUNTER — Ambulatory Visit: Payer: Self-pay | Admitting: Obstetrics & Gynecology

## 2017-04-24 ENCOUNTER — Encounter: Payer: Self-pay | Admitting: Family Medicine

## 2017-04-24 ENCOUNTER — Ambulatory Visit (INDEPENDENT_AMBULATORY_CARE_PROVIDER_SITE_OTHER): Payer: BC Managed Care – PPO | Admitting: Family Medicine

## 2017-04-24 DIAGNOSIS — R140 Abdominal distension (gaseous): Secondary | ICD-10-CM

## 2017-04-24 LAB — HCG, QUANTITATIVE, PREGNANCY: QUANTITATIVE HCG: 0.02 m[IU]/mL

## 2017-04-24 NOTE — Addendum Note (Signed)
Addended by: Lucille Passy on: 04/24/2017 11:17 AM   Modules accepted: Orders

## 2017-04-24 NOTE — Patient Instructions (Signed)
Please stop by to see Rosaria Ferries on your way out.

## 2017-04-24 NOTE — Progress Notes (Signed)
Subjective:   Patient ID: Alexis Hoffman, female    DOB: Jul 07, 1971, 46 y.o.   MRN: [id number]  Alexis Hoffman is a pleasant 46 y.o. year old female who presents to clinic today with Follow-up  on 04/24/2017  HPI:  ? Pregnancy-  H/o ablation but feels she is pregnant.  Gaining weight- feels she can feel baby move.  Went to [facility] per pt- saw a  Midwife. Per pt, was told Upreg and hcg quant were neg and that she is not pregnant.  Here today because she does not know what to do now.  Having morning sickness, breast tenderness and her abdomen looks and feels pregnant.  Wt Readings from Last 3 Encounters:  04/24/17 199 lb 12.8 oz (90.6 kg)  03/24/17 199 lb 1.6 oz (90.3 kg)  02/23/17 200 lb 8 oz (90.9 kg)     Current Outpatient Prescriptions on File Prior to Visit  Medication Sig Dispense Refill  . bisoprolol (ZEBETA) 10 MG tablet Take 1 tablet (10 mg total) by mouth daily. 30 tablet 0  . ondansetron (ZOFRAN) 4 MG tablet Take 1 tablet (4 mg total) by mouth every 8 (eight) hours as needed for nausea or vomiting. 30 tablet 1   Current Facility-Administered Medications on File Prior to Visit  Medication Dose Route Frequency Provider Last Rate Last Dose  . penicillin G benzathine (BICILLIN-LA) 600000 UNIT/ML injection 600,000 Units  600,000 Units Intramuscular Once Lucille Passy, MD        Allergies  Allergen Reactions  . Codeine Nausea And Vomiting  . Vicodin [Hydrocodone-Acetaminophen] Hives    Past Medical History:  Diagnosis Date  . Hypertension     Past Surgical History:  Procedure Laterality Date  . ABLATION    . CESAREAN SECTION    . CHOLECYSTECTOMY    . TUBAL LIGATION      Family History  Problem Relation Age of Onset  . Arthritis Mother   . COPD Father   . Hypertension Father     Social History   Social History  . Marital status: Married    Spouse name: N/A  . Number of children: N/A  . Years of education: N/A   Occupational History  .  Not on file.   Social History Main Topics  . Smoking status: Never Smoker  . Smokeless tobacco: Never Used  . Alcohol use No     Comment: glass of wine at night  . Drug use: No  . Sexual activity: Yes    Birth control/ protection: Surgical     Comment: Ablation    Other Topics Concern  . Not on file   Social History Narrative  . No narrative on file   The PMH, PSH, Social History, Family History, Medications, and allergies have been reviewed in [facility], and have been updated if relevant.   Review of Systems  Constitutional: Negative.   Gastrointestinal: Positive for abdominal distention, nausea and vomiting.  Genitourinary: Negative for vaginal bleeding.  Musculoskeletal: Negative.   Allergic/Immunologic: Negative.   Neurological: Negative.   Hematological: Negative.   Psychiatric/Behavioral: Negative.   All other systems reviewed and are negative.      Objective:    BP 116/62   Pulse 66   Temp 98.4 F (36.9 C) (Oral)   Resp 16   Ht 5' 7"  (1.702 m)   Wt 199 lb 12.8 oz (90.6 kg)   SpO2 98%   BMI 31.29 kg/m    Physical Exam   General:  Well-developed,well-nourished,in  no acute distress; alert,appropriate and cooperative throughout examination Head:  normocephalic and atraumatic.   Eyes:  vision grossly intact, PERRL Ears:  R ear normal and L ear normal externally, TMs clear bilaterally Nose:  no external deformity.   Mouth:  good dentition.   Neck:  No deformities, masses, or tenderness noted. Lungs:  Normal respiratory effort, chest expands symmetrically. Lungs are clear to auscultation, no crackles or wheezes. Heart:  Normal rate and regular rhythm. S1 and S2 normal without gallop, murmur, click, rub or other extra sounds. Abdomen: abdomen does look distended- as if she were pregant Msk:  No deformity or scoliosis noted of thoracic or lumbar spine.   Extremities:  No clubbing, cyanosis, edema, or deformity noted with normal full range of motion of all joints.    Neurologic:  alert & oriented X3 and gait normal.   Skin:  Intact without suspicious lesions or rashes Cervical Nodes:  No lymphadenopathy noted Axillary Nodes:  No palpable lymphadenopathy Psych:  Cognition and judgment appear intact. Alert and cooperative with normal attention span and concentration. No apparent delusions, illusions, hallucinations       Assessment & Plan:   Abdominal distension - Plan: US Pelvis Complete, hCG, quantitative, pregnancy No Follow-up on file.

## 2017-04-24 NOTE — Assessment & Plan Note (Signed)
Unusual case. Will order [place] of abdomen along with hcg quant. If neg, ? pseudocyesis

## 2017-04-25 ENCOUNTER — Ambulatory Visit
Admission: RE | Admit: 2017-04-25 | Discharge: 2017-04-25 | Disposition: A | Discharge status: Home / Self Care | Payer: BC Managed Care – PPO | Source: Ambulatory Visit | Attending: Family Medicine | Admitting: Family Medicine

## 2017-04-25 DIAGNOSIS — R140 Abdominal distension (gaseous): Secondary | ICD-10-CM

## 2017-04-26 ENCOUNTER — Other Ambulatory Visit: Payer: Self-pay | Admitting: Family Medicine

## 2017-04-28 NOTE — Telephone Encounter (Signed)
Last refill 02/23/17 Last OV 04/24/17 Ok to refill?

## 2017-05-09 ENCOUNTER — Ambulatory Visit (INDEPENDENT_AMBULATORY_CARE_PROVIDER_SITE_OTHER): Payer: BC Managed Care – PPO | Admitting: Obstetrics & Gynecology

## 2017-05-09 ENCOUNTER — Encounter: Payer: Self-pay | Admitting: Obstetrics & Gynecology

## 2017-05-09 VITALS — BP 120/80 | HR 79 | Ht 67.0 in | Wt 203.0 lb

## 2017-05-09 DIAGNOSIS — Z01419 Encounter for gynecological examination (general) (routine) without abnormal findings: Secondary | ICD-10-CM | POA: Diagnosis not present

## 2017-05-09 DIAGNOSIS — R110 Nausea: Secondary | ICD-10-CM

## 2017-05-09 DIAGNOSIS — Z1231 Encounter for screening mammogram for malignant neoplasm of breast: Secondary | ICD-10-CM

## 2017-05-09 DIAGNOSIS — Z1239 Encounter for other screening for malignant neoplasm of breast: Secondary | ICD-10-CM

## 2017-05-09 DIAGNOSIS — R140 Abdominal distension (gaseous): Secondary | ICD-10-CM | POA: Diagnosis not present

## 2017-05-09 DIAGNOSIS — Z0000 Encounter for general adult medical examination without abnormal findings: Secondary | ICD-10-CM

## 2017-05-09 DIAGNOSIS — Z124 Encounter for screening for malignant neoplasm of cervix: Secondary | ICD-10-CM | POA: Diagnosis not present

## 2017-05-09 NOTE — Progress Notes (Addendum)
HPI:      [name] is a 46 y.o. [phone] who LMP was No LMP recorded. Patient has had an ablation.,she presents today for her annual examination. The patient has no complaints today. The patient is sexually active. Her last pap: was normal and was years ago and last mammogram: was normal. The patient does perform self breast exams.  There is no notable family history of breast or ovarian cancer in her family.  The patient has regular exercise: yes.  The patient denies current symptoms of depression.    Pt reports 3 month h/o pregnancy like sx's of nausea, vomiting, abdominal distension with change in clothing waist size; no pain, no bleeding.  Has had beta hcg neg, other labs, and 2 [place] of pelvis done.  Last [place] suggested 2 cystic like spaces in endometrium, one 1 cm in size.  Concerned as no answers to her sx's.  GYN History: Contraception: tubal ligation  PMHx: Past Medical History:  Diagnosis Date  . Hypertension    Past Surgical History:  Procedure Laterality Date  . ABLATION    . CESAREAN SECTION    . CHOLECYSTECTOMY    . TUBAL LIGATION     Family History  Problem Relation Age of Onset  . Arthritis Mother   . COPD Father   . Hypertension Father    Social History  Substance Use Topics  . Smoking status: Never Smoker  . Smokeless tobacco: Never Used  . Alcohol use No     Comment: glass of wine at night    Current Outpatient Prescriptions:  .  bisoprolol (ZEBETA) 10 MG tablet, Take 1 tablet (10 mg total) by mouth daily., Disp: 30 tablet, Rfl: 0 .  ondansetron (ZOFRAN) 4 MG tablet, TAKE ONE (1) TABLET BY MOUTH EVERY EIGHTHOURS AS NEEDED FOR NAUSEA AND/OR VOMITING, Disp: 30 tablet, Rfl: 0 No current facility-administered medications for this visit.   Facility-Administered Medications Ordered in Other Visits:  .  penicillin G benzathine (BICILLIN-LA) 600000 UNIT/ML injection 600,000 Units, 600,000 Units, Intramuscular, Once, Lucille Passy, MD Allergies: Codeine and  Vicodin [hydrocodone-acetaminophen]  Review of Systems  Constitutional: Negative for chills, fever and malaise/fatigue.  HENT: Negative for congestion, sinus pain and sore throat.   Eyes: Negative for blurred vision and pain.  Respiratory: Negative for cough and wheezing.   Cardiovascular: Negative for chest pain and leg swelling.  Gastrointestinal: Negative for abdominal pain, constipation, diarrhea, heartburn, nausea and vomiting.  Genitourinary: Negative for dysuria, frequency, hematuria and urgency.  Musculoskeletal: Negative for back pain, joint pain, myalgias and neck pain.  Skin: Negative for itching and rash.  Neurological: Negative for dizziness, tremors and weakness.  Endo/Heme/Allergies: Does not bruise/bleed easily.  Psychiatric/Behavioral: Negative for depression. The patient is not nervous/anxious and does not have insomnia.     Objective: BP 120/80   Pulse 79   Ht 5' 7"  (1.702 m)   Wt 203 lb (92.1 kg)   BMI 31.79 kg/m   Filed Weights   05/09/17 1353  Weight: 203 lb (92.1 kg)   Body mass index is 31.79 kg/m. Physical Exam  Constitutional: She is oriented to person, place, and time. She appears well-developed and well-nourished. No distress.  Genitourinary: Rectum normal, vagina normal and uterus normal. Pelvic exam was performed with patient supine. There is no rash or lesion on the right labia. There is no rash or lesion on the left labia. Vagina exhibits no lesion. No bleeding in the vagina. Right adnexum does not display mass  and does not display tenderness. Left adnexum does not display mass and does not display tenderness. Cervix does not exhibit motion tenderness, lesion, friability or polyp.   Uterus is mobile and midaxial. Uterus is not enlarged or exhibiting a mass.  HENT:  Head: Normocephalic and atraumatic. Head is without laceration.  Right Ear: Hearing normal.  Left Ear: Hearing normal.  Nose: No epistaxis.  No foreign bodies.  Mouth/Throat: Uvula is  midline, oropharynx is clear and moist and mucous membranes are normal.  Eyes: Pupils are equal, round, and reactive to light.  Neck: Normal range of motion. Neck supple. No thyromegaly present.  Cardiovascular: Normal rate and regular rhythm.  Exam reveals no gallop and no friction rub.   No murmur heard. Pulmonary/Chest: Effort normal and breath sounds normal. No respiratory distress. She has no wheezes. Right breast exhibits no mass, no skin change and no tenderness. Left breast exhibits no mass, no skin change and no tenderness.  Abdominal: Soft. Bowel sounds are normal. She exhibits no distension. There is no tenderness. There is no rebound.  Musculoskeletal: Normal range of motion.  Neurological: She is alert and oriented to person, place, and time. No cranial nerve deficit.  Skin: Skin is warm and dry.  Psychiatric: She has a normal mood and affect. Judgment normal.  Vitals reviewed.   Assessment:  ANNUAL EXAM 1. Annual physical exam   2. Abdominal distension   3. Nausea   4. Screening for cervical cancer   5. Screening for breast cancer    Screening Plan:            1.  Cervical Screening-  Pap smear done today  2. Breast screening- Exam annually and mammogram>40 planned   3. Colonoscopy every 10 years, Hemoccult testing - after age 58  4. Labs managed by PCP  5. Counseling for contraception: bilateral tubal ligation  Other:  1. Annual physical exam  2. Abdominal distension, Abn US findings, Prior ablation - [place] Sonohysterogram; Future - Consider EMB as well - Consider GI referral - Consider CT of abd and pelvis to cover other etiologies  3. Nausea - [place] Sonohysterogram; Future  4. Screening for cervical cancer - IGP, Aptima HPV  5. Screening for breast cancer - MM DIGITAL SCREENING BILATERAL; Future    F/U  Return in about 1 week (around 05/16/2017) for [facility] and PH appt.  Barnett Applebaum, MD, [facility], [facility] 05/09/2017  2:16  PM

## 2017-05-09 NOTE — Patient Instructions (Signed)
Sonohysterogram A sonohysterogram is a procedure to examine the inside of the uterus. This exam uses sound waves that are sent to a computer to make images of the lining of the uterus (endometrium). To get the best images, a germ-free, salt-water solution (sterile saline) is put into the uterus through the vagina. You may have this procedure if you have certain reproductive problems, such as abnormal bleeding, infertility, or miscarriage. This procedure can show what may be causing these problems. Possible causes include scarring or abnormal growths such as fibroids inside your uterus. It can also show if your uterus is an abnormal shape or if the lining of the uterus is too thin. Tell a health care provider about:  All medicines you are taking, including vitamins, herbs, eye drops, creams, and over-the-counter medicines.  Any allergies you have.  Any blood disorders you have.  Any surgeries you have had.  Any medical conditions you have.  Whether you are pregnant or may be pregnant.  The date of the first day of your last period.  Any signs of infection, such as fever, pain in your lower abdomen, or abnormal discharge from your vagina. What are the risks? Generally, this is a safe procedure. However, problems may occur, including:  Abdominal pain or cramping.  Light bleeding (spotting).  Increased vaginal discharge.  Infection.  What happens before the procedure?  Your health care provider may have you take an over-the-counter pain medicine.  You may be given medicine to stop any abnormal bleeding.  You may be given antibiotic medicine to help prevent infection.  You may be asked to take a pregnancy test. This is usually in the form of a urine test.  You may have a pelvic exam.  You will be asked to empty your bladder. What happens during the procedure?  You will lie down on the exam table with your feet in stirrups or with your knees bent and your feet flat on the  table.  A slender, handheld device (transducer) will be lubricated and placed into your vagina.  The transducer will be positioned to send sound waves to your uterus. The sound waves are sent to a computer and are turned into images, which your health care provider sees during the procedure.  The transducer will be removed from your vagina.  An instrument will be inserted to widen the opening of your vagina (speculum).  A swab with germ-killing solution (antiseptic) will be used to clean the opening to your uterus (cervix).  A long, thin tube (catheter) will be placed through your cervix into your uterus.  The speculum will be removed.  The transducer will be placed back into your vagina to take more images.  Your uterus will be filled with a germ-free, salt-water solution (sterile saline) through the catheter. You may feel some cramping.  A fluid that contains air bubbles may be sent through the catheter to make it easier to see the fallopian tubes.  The transducer and catheter will be removed. The procedure may vary among health care providers and hospitals. What happens after the procedure?  It is up to you to get the results of your procedure. Ask your health care provider, or the department that is doing the procedure, when your results will be ready. Summary  A sonohysterogram is a procedure that creates images of the inside of the uterus.  The risks of this procedure are very low. Most women experience cramping and spotting after the procedure.  You may need to have  a pelvic exam and take a pregnancy test before this procedure. This procedure will not be done if you are pregnant or have an infection. This information is not intended to replace advice given to you by your health care provider. Make sure you discuss any questions you have with your health care provider. Document Released: 01/06/2014 Document Revised: 07/18/2016 Document Reviewed: 07/18/2016 Elsevier  Interactive Patient Education  2017 [employer].

## 2017-05-10 ENCOUNTER — Ambulatory Visit: Payer: BC Managed Care – PPO | Admitting: Gastroenterology

## 2017-05-12 ENCOUNTER — Other Ambulatory Visit: Payer: Self-pay | Admitting: Obstetrics & Gynecology

## 2017-05-12 DIAGNOSIS — R110 Nausea: Secondary | ICD-10-CM

## 2017-05-12 DIAGNOSIS — R140 Abdominal distension (gaseous): Secondary | ICD-10-CM

## 2017-05-12 LAB — IGP, APTIMA HPV
HPV Aptima: NEGATIVE
PAP SMEAR COMMENT: 0

## 2017-05-15 ENCOUNTER — Other Ambulatory Visit: Payer: Self-pay | Admitting: Family Medicine

## 2017-05-16 ENCOUNTER — Other Ambulatory Visit: Payer: Self-pay | Admitting: Family Medicine

## 2017-05-16 MED ORDER — BISOPROLOL FUMARATE 10 MG PO TABS: 10.0000 mg | ORAL_TABLET | Freq: Every day | ORAL | 0 refills | Status: DC

## 2017-05-19 ENCOUNTER — Other Ambulatory Visit: Payer: BC Managed Care – PPO

## 2017-05-19 ENCOUNTER — Ambulatory Visit: Payer: BC Managed Care – PPO | Admitting: Obstetrics & Gynecology

## 2017-05-30 ENCOUNTER — Ambulatory Visit: Payer: BC Managed Care – PPO | Admitting: Obstetrics & Gynecology

## 2017-05-30 ENCOUNTER — Other Ambulatory Visit: Payer: BC Managed Care – PPO

## 2017-06-06 ENCOUNTER — Encounter: Payer: Self-pay | Admitting: Family Medicine

## 2017-06-06 ENCOUNTER — Ambulatory Visit: Payer: BC Managed Care – PPO | Admitting: Family Medicine

## 2017-06-06 MED ORDER — BISOPROLOL FUMARATE 10 MG PO TABS: 10.0000 mg | ORAL_TABLET | Freq: Every day | ORAL | 3 refills | Status: DC

## 2017-06-19 ENCOUNTER — Ambulatory Visit (INDEPENDENT_AMBULATORY_CARE_PROVIDER_SITE_OTHER): Payer: BC Managed Care – PPO | Admitting: Obstetrics & Gynecology

## 2017-06-19 ENCOUNTER — Encounter: Payer: Self-pay | Admitting: Obstetrics & Gynecology

## 2017-06-19 ENCOUNTER — Ambulatory Visit (INDEPENDENT_AMBULATORY_CARE_PROVIDER_SITE_OTHER): Payer: BC Managed Care – PPO

## 2017-06-19 VITALS — BP 120/80 | HR 58 | Ht 67.0 in | Wt 208.0 lb

## 2017-06-19 DIAGNOSIS — R140 Abdominal distension (gaseous): Secondary | ICD-10-CM

## 2017-06-19 DIAGNOSIS — R110 Nausea: Secondary | ICD-10-CM

## 2017-06-19 DIAGNOSIS — R935 Abnormal findings on diagnostic imaging of other abdominal regions, including retroperitoneum: Secondary | ICD-10-CM | POA: Diagnosis not present

## 2017-06-19 NOTE — Progress Notes (Signed)
   Endometrial Biopsy After discussion with the patient regarding her abnormal endometrium by ultrasound I recommended that she proceed with an endometrial biopsy for further diagnosis. The risks, benefits, alternatives, and indications for an endometrial biopsy were discussed with the patient in detail. She understood the risks including infection, bleeding, cervical laceration and uterine perforation.  Verbal consent was obtained.   PROCEDURE NOTE:  Pipelle endometrial biopsy was performed using aseptic technique with iodine preparation.  The uterus was sounded to a length of 5 cm n(may be limited due to adhesive dz following ablation).  Scant sampling was obtained with minimal blood loss.  The patient tolerated the procedure well.  Disposition will be pending pathology.  Sonohysterogram Procedure Note  The indications for this procedure were reviewed with the patient. The procedure was explained in detail and all questions were answered.  The patient was placed in the lithotomy position. A graves speculum was introduced into the vagina and the cervix was visualized. The cervix was prepped with iodine solution. A Cook's Hysterography catheter was then introduced into the uterine cavity and the speculum was removed.   Sterile sonohysterography with 3D Reconstruction was performed. The endometrial cavity was distended with sterile saline. The findings are as follows: filling defects.  ES 5-32m.    The patient tolerated the procedure well without complication, and was discharged to home.   Counseled as to unclear etiology for her pain,nausea, bloating.  She has had no bleeding.  Will refer to GI for further evaluation.  PBarnett Applebaum MD, [facility], [facility] 06/19/2017  2:41 PM

## 2017-06-21 LAB — PATHOLOGY

## 2017-06-22 ENCOUNTER — Ambulatory Visit
Admission: RE | Admit: 2017-06-22 | Discharge: 2017-06-22 | Disposition: A | Discharge status: Home / Self Care | Payer: BC Managed Care – PPO | Source: Ambulatory Visit | Attending: Gastroenterology | Admitting: Gastroenterology

## 2017-06-22 ENCOUNTER — Encounter: Payer: Self-pay | Admitting: Gastroenterology

## 2017-06-22 ENCOUNTER — Ambulatory Visit (INDEPENDENT_AMBULATORY_CARE_PROVIDER_SITE_OTHER): Payer: BC Managed Care – PPO | Admitting: Gastroenterology

## 2017-06-22 VITALS — BP 119/74 | HR 60 | Temp 98.6°F | Ht 67.0 in | Wt 209.0 lb

## 2017-06-22 DIAGNOSIS — R140 Abdominal distension (gaseous): Secondary | ICD-10-CM

## 2017-06-22 DIAGNOSIS — R112 Nausea with vomiting, unspecified: Secondary | ICD-10-CM

## 2017-06-22 NOTE — Progress Notes (Signed)
Gastroenterology Consultation  Referring Provider:     Lucille Passy, MD Primary Care Physician:  Lucille Passy, MD Primary Gastroenterologist:  Dr. Allen Norris     Reason for Consultation:     Nausea vomiting and bloating        HPI:   Alexis Hoffman is a 46 y.o. y/o female referred for consultation & management of nausea vomiting and bloating by Dr. Deborra Medina, Marciano Sequin, MD.  This patient comes in today with a report of nausea vomiting with abdominal bloating that had occurred for a few months. The patient states she would vomit every morning and felt like she had morning sickness. The patient states that her symptoms have resolved in the last month and she has no further nausea vomiting but still has some bloating. She states that her pants size have gone up to sizes but her weight has remained the same. There is no report of any family history of colon cancer colon polyps. The patient also denies any change in bowel habits. She does chew gum and she reports that she does not drink carbonated drinks. She also denies any abdominal pain fevers chills black stools or bloody stools. The patient was seen by GYN and was found to have cystic lesions in the uterus. They wanted her evaluated for her GI symptoms before proceeding with any further intervention gynecologically.  Past Medical History:  Diagnosis Date  . Hypertension     Past Surgical History:  Procedure Laterality Date  . ABLATION    . CESAREAN SECTION    . CHOLECYSTECTOMY    . TUBAL LIGATION      Prior to Admission medications   Medication Sig Start Date End Date Taking? Authorizing Provider  bisoprolol (ZEBETA) 10 MG tablet Take 1 tablet (10 mg total) by mouth daily. 06/06/17  Yes Lucille Passy, MD  ondansetron (ZOFRAN) 4 MG tablet TAKE ONE TABLET BY MOUTH EVERY 8 HOURS AS NEEDED FOR NAUSEA AND/OR VOMITING Patient not taking: Reported on 06/22/2017 05/16/17   Lucille Passy, MD    Family History  Problem Relation Age of Onset  .  Arthritis Mother   . COPD Father   . Hypertension Father      Social History  Substance Use Topics  . Smoking status: Never Smoker  . Smokeless tobacco: Never Used  . Alcohol use No     Comment: glass of wine at night    Allergies as of 06/22/2017 - Review Complete 06/22/2017  Allergen Reaction Noted  . Codeine Nausea And Vomiting 07/02/2012  . Vicodin [hydrocodone-acetaminophen] Hives 07/02/2012    Review of Systems:    All systems reviewed and negative except where noted in HPI.   Physical Exam:  BP 119/74   Pulse 60   Temp 98.6 F (37 C) (Oral)   Ht 5' 7"  (1.702 m)   Wt 209 lb (94.8 kg)   BMI 32.73 kg/m  No LMP recorded. Patient has had an ablation. Psych:  Alert and cooperative. Normal mood and affect. General:   Alert,  Well-developed, well-nourished, pleasant and cooperative in NAD Head:  Normocephalic and atraumatic. Eyes:  Sclera clear, no icterus.   Conjunctiva pink. Ears:  Normal auditory acuity. Nose:  No deformity, discharge, or lesions. Mouth:  No deformity or lesions,oropharynx pink & moist. Neck:  Supple; no masses or thyromegaly. Lungs:  Respirations even and unlabored.  Clear throughout to auscultation.   No wheezes, crackles, or rhonchi. No acute distress. Heart:  Regular rate and  rhythm; no murmurs, clicks, rubs, or gallops. Abdomen:  Normal bowel sounds.  No bruits.  Soft, non-tender and non-distended without masses, hepatosplenomegaly or hernias noted.  No guarding or rebound tenderness.  Negative Carnett sign.   Rectal:  Deferred.  Msk:  Symmetrical without gross deformities.  Good, equal movement & strength bilaterally. Pulses:  Normal pulses noted. Extremities:  No clubbing or edema.  No cyanosis. Neurologic:  Alert and oriented x3;  grossly normal neurologically. Skin:  Intact without significant lesions or rashes.  No jaundice. Lymph Nodes:  No significant cervical adenopathy. Psych:  Alert and cooperative. Normal mood and affect.  Imaging  Studies: No results found.  Assessment and Plan:   [name] is a 46 y.o. y/o female who comes in with some bloating and a history of nausea vomiting that occurred mostly in the morning. The patient has been told that her nausea vomiting in the morning is likely related to reflux while she spends the evening supine. The patient is not having the nausea vomiting at the present time but continues to have bloating. The patient may be swallowing air from her continued reflux or she may be swallowing air because of chewing gum. The patient has been told to stop chewing gum and has been given samples of Dexilant to take for a few weeks to see if her bloating decreases. The patient will also be set up for a KUB to make sure that there is not any distention of her small intestines or stool filling her colon. The patient has been explained the plan and agrees with it.  Lucilla Lame, MD. Marval Regal   Note: This dictation was prepared with Dragon dictation along with smaller phrase technology. Any transcriptional errors that result from this process are unintentional.

## 2017-06-23 ENCOUNTER — Telehealth: Payer: Self-pay

## 2017-06-23 NOTE — Telephone Encounter (Signed)
Pt notified of xray results and also to start Miralax once a day.

## 2017-06-23 NOTE — Telephone Encounter (Signed)
-----   Message from Lucilla Lame, MD sent at 06/22/2017  7:20 PM EDT ----- Let the patient know that her x-ray showed a increased amount of stool throughout her colon.  The patient should start on MiraLAX once a day and this may decrease her bloating and gas.

## 2017-07-17 ENCOUNTER — Ambulatory Visit: Payer: BC Managed Care – PPO | Admitting: Gastroenterology

## 2017-08-21 ENCOUNTER — Ambulatory Visit: Payer: BC Managed Care – PPO | Admitting: Nurse Practitioner

## 2017-08-21 ENCOUNTER — Encounter: Payer: Self-pay | Admitting: Nurse Practitioner

## 2017-08-21 VITALS — BP 124/86 | HR 103 | Temp 98.1°F | Ht 67.0 in | Wt 210.0 lb

## 2017-08-21 DIAGNOSIS — J0100 Acute maxillary sinusitis, unspecified: Secondary | ICD-10-CM | POA: Diagnosis not present

## 2017-08-21 DIAGNOSIS — J4521 Mild intermittent asthma with (acute) exacerbation: Secondary | ICD-10-CM | POA: Diagnosis not present

## 2017-08-21 MED ORDER — GUAIFENESIN ER 600 MG PO TB12: 600.0000 mg | ORAL_TABLET | Freq: Two times a day (BID) | ORAL | 0 refills | Status: DC | PRN

## 2017-08-21 MED ORDER — IPRATROPIUM BROMIDE 0.03 % NA SOLN: 2.0000 | Freq: Two times a day (BID) | NASAL | 0 refills | Status: DC

## 2017-08-21 MED ORDER — PROMETHAZINE-DM 6.25-15 MG/5ML PO SYRP: 5.0000 mL | ORAL_SOLUTION | Freq: Three times a day (TID) | ORAL | 0 refills | Status: DC | PRN

## 2017-08-21 MED ORDER — AZITHROMYCIN 250 MG PO TABS: 250.0000 mg | ORAL_TABLET | Freq: Every day | ORAL | 0 refills | Status: DC

## 2017-08-21 MED ORDER — METHYLPREDNISOLONE ACETATE 40 MG/ML IJ SUSP
40.0000 mg | Freq: Once | INTRAMUSCULAR | Status: AC
  Administered 2017-08-21: 40 mg via INTRAMUSCULAR

## 2017-08-21 MED ORDER — ALBUTEROL SULFATE HFA 108 (90 BASE) MCG/ACT IN AERS: 1.0000 | INHALATION_SPRAY | Freq: Four times a day (QID) | RESPIRATORY_TRACT | 0 refills | Status: DC | PRN

## 2017-08-21 NOTE — Patient Instructions (Signed)
Encourage adequate oral hydration.  URI Instructions: Encourage adequate oral hydration.  Avoid decongestants if you have high blood pressure. Use" Delsym" or" Robitussin" cough syrup varietis for cough.  You can use plain "Tylenol" or "Advi"l for fever, chills and achyness.

## 2017-08-21 NOTE — Progress Notes (Signed)
Subjective:  Patient ID: Alexis Hoffman, female    DOB: 09-30-1970  Age: 46 y.o. MRN: [id number]  CC: Cough (coughing green mucus,headache,sore throat,SOB,loss voice/ [facility] and on nebulizer--not so much/ going on for 5 days)  Cough  This is a new problem. The current episode started in the past 7 days. The problem has been rapidly worsening. The problem occurs constantly. The cough is productive of purulent sputum. Associated symptoms include chest pain, chills, nasal congestion, postnasal drip, rhinorrhea, a sore throat, shortness of breath and wheezing. Pertinent negatives include no fever or headaches. The symptoms are aggravated by lying down and cold air. She has tried oral steroids, a beta-agonist inhaler, OTC cough suppressant and prescription cough suppressant for the symptoms. The treatment provided mild relief. Her past medical history is significant for asthma.   Outpatient Medications Prior to Visit  Medication Sig Dispense Refill  . bisoprolol (ZEBETA) 10 MG tablet Take 1 tablet (10 mg total) by mouth daily. 90 tablet 3  . predniSONE (DELTASONE) 20 MG tablet Take by mouth.    . ondansetron (ZOFRAN) 4 MG tablet TAKE ONE TABLET BY MOUTH EVERY 8 HOURS AS NEEDED FOR NAUSEA AND/OR VOMITING (Patient not taking: Reported on 06/22/2017) 30 tablet 0   Facility-Administered Medications Prior to Visit  Medication Dose Route Frequency Provider Last Rate Last Dose  . penicillin G benzathine (BICILLIN-LA) 600000 UNIT/ML injection 600,000 Units  600,000 Units Intramuscular Once Lucille Passy, MD        ROS See HPI  Objective:  BP 124/86   Pulse (!) 103   Temp 98.1 F (36.7 C)   Ht 5' 7"  (1.702 m)   Wt 210 lb (95.3 kg)   SpO2 97%   BMI 32.89 kg/m   BP Readings from Last 3 Encounters:  08/21/17 124/86  06/22/17 119/74  06/19/17 120/80    Wt Readings from Last 3 Encounters:  08/21/17 210 lb (95.3 kg)  06/22/17 209 lb (94.8 kg)  06/19/17 208 lb (94.3  kg)    Physical Exam  Constitutional: She is oriented to person, place, and time.  HENT:  Right Ear: Tympanic membrane, external ear and ear canal normal.  Left Ear: Tympanic membrane, external ear and ear canal normal.  Nose: Mucosal edema and rhinorrhea present. Right sinus exhibits maxillary sinus tenderness. Right sinus exhibits no frontal sinus tenderness. Left sinus exhibits maxillary sinus tenderness. Left sinus exhibits no frontal sinus tenderness.  Mouth/Throat: Uvula is midline. No trismus in the jaw. Posterior oropharyngeal erythema present. No oropharyngeal exudate.  Eyes: No scleral icterus.  Neck: Normal range of motion. Neck supple.  Cardiovascular: Normal rate and normal heart sounds.  Pulmonary/Chest: Effort normal and breath sounds normal.  Musculoskeletal: She exhibits no edema.  Lymphadenopathy:    She has no cervical adenopathy.  Neurological: She is alert and oriented to person, place, and time.  Vitals reviewed.   Lab Results  Component Value Date   WBC 5.2 12/23/2013   HGB 14.3 12/23/2013   HCT 42.0 12/23/2013   PLT 216.0 12/23/2013   GLUCOSE 86 02/23/2017   CHOL 199 01/11/2016   TRIG 167.0 (H) 01/11/2016   HDL 43.60 01/11/2016   LDLDIRECT 77.0 10/09/2014   LDLCALC 122 (H) 01/11/2016   ALT 21 02/23/2017   AST 17 02/23/2017   NA 139 02/23/2017   K 4.7 02/23/2017   CL 103 02/23/2017   CREATININE 0.77 02/23/2017   BUN 12 02/23/2017   CO2 29 02/23/2017  TSH 0.77 02/23/2017   HGBA1C 5.7 02/23/2017    Dg Abd 1 View  Result Date: 06/22/2017 CLINICAL DATA:  Chronic abdominal bloating. EXAM: ABDOMEN - 1 VIEW COMPARISON:  None. FINDINGS: The bowel gas pattern is normal. Status post cholecystectomy. Moderate amount of stool seen throughout the colon. No radio-opaque calculi or other significant radiographic abnormality are seen. IMPRESSION: Moderate stool burden.  No evidence of bowel obstruction or ileus. Electronically Signed   By: Marijo Conception, M.D.    On: 06/22/2017 15:20    Assessment & Plan:   Alexis Hoffman was seen today for cough.  Diagnoses and all orders for this visit:  Mild intermittent asthma with acute exacerbation -     methylPREDNISolone acetate (DEPO-MEDROL) injection 40 mg -     promethazine-dextromethorphan (PROMETHAZINE-DM) 6.25-15 MG/5ML syrup; Take 5 mLs by mouth 3 (three) times daily as needed for cough. -     guaiFENesin (MUCINEX) 600 MG 12 hr tablet; Take 1 tablet (600 mg total) by mouth 2 (two) times daily as needed for cough or to loosen phlegm. -     azithromycin (ZITHROMAX Z-PAK) 250 MG tablet; Take 1 tablet (250 mg total) by mouth daily. Take 2tabs on first day, then 1tab once a day till complete -     albuterol (PROVENTIL HFA;VENTOLIN HFA) 108 (90 Base) MCG/ACT inhaler; Inhale 1-2 puffs into the lungs every 6 (six) hours as needed. -     ipratropium (ATROVENT) 0.03 % nasal spray; Place 2 sprays into both nostrils 2 (two) times daily. Do not use for more than 5days.  Acute non-recurrent maxillary sinusitis -     methylPREDNISolone acetate (DEPO-MEDROL) injection 40 mg -     promethazine-dextromethorphan (PROMETHAZINE-DM) 6.25-15 MG/5ML syrup; Take 5 mLs by mouth 3 (three) times daily as needed for cough. -     guaiFENesin (MUCINEX) 600 MG 12 hr tablet; Take 1 tablet (600 mg total) by mouth 2 (two) times daily as needed for cough or to loosen phlegm. -     azithromycin (ZITHROMAX Z-PAK) 250 MG tablet; Take 1 tablet (250 mg total) by mouth daily. Take 2tabs on first day, then 1tab once a day till complete -     albuterol (PROVENTIL HFA;VENTOLIN HFA) 108 (90 Base) MCG/ACT inhaler; Inhale 1-2 puffs into the lungs every 6 (six) hours as needed. -     ipratropium (ATROVENT) 0.03 % nasal spray; Place 2 sprays into both nostrils 2 (two) times daily. Do not use for more than 5days.   I am having Alexis Hoffman start on promethazine-dextromethorphan, guaiFENesin, azithromycin, albuterol, and ipratropium. I am also having  her maintain her ondansetron, bisoprolol, and predniSONE. We administered methylPREDNISolone acetate.  Meds ordered this encounter  Medications  . methylPREDNISolone acetate (DEPO-MEDROL) injection 40 mg  . promethazine-dextromethorphan (PROMETHAZINE-DM) 6.25-15 MG/5ML syrup    Sig: Take 5 mLs by mouth 3 (three) times daily as needed for cough.    Dispense:  180 mL    Refill:  0    Order Specific Question:   Supervising Provider    Answer:   Lucille Passy [[id number]]  . guaiFENesin (MUCINEX) 600 MG 12 hr tablet    Sig: Take 1 tablet (600 mg total) by mouth 2 (two) times daily as needed for cough or to loosen phlegm.    Dispense:  14 tablet    Refill:  0    Order Specific Question:   Supervising Provider    Answer:   Lucille Passy [[id number]]  . azithromycin [facility]  Z-PAK) 250 MG tablet    Sig: Take 1 tablet (250 mg total) by mouth daily. Take 2tabs on first day, then 1tab once a day till complete    Dispense:  6 tablet    Refill:  0    Order Specific Question:   Supervising Provider    Answer:   Lucille Passy [[id number]]  . albuterol (PROVENTIL HFA;VENTOLIN HFA) 108 (90 Base) MCG/ACT inhaler    Sig: Inhale 1-2 puffs into the lungs every 6 (six) hours as needed.    Dispense:  1 Inhaler    Refill:  0    Order Specific Question:   Supervising Provider    Answer:   Lucille Passy [[id number]]  . ipratropium (ATROVENT) 0.03 % nasal spray    Sig: Place 2 sprays into both nostrils 2 (two) times daily. Do not use for more than 5days.    Dispense:  30 mL    Refill:  0    Order Specific Question:   Supervising Provider    Answer:   Lucille Passy [[id number]]    Follow-up: No Follow-up on file.  Wilfred Lacy, NP

## 2018-02-08 ENCOUNTER — Ambulatory Visit: Payer: BC Managed Care – PPO | Admitting: Family Medicine

## 2018-02-08 ENCOUNTER — Encounter: Payer: Self-pay | Admitting: Family Medicine

## 2018-02-08 DIAGNOSIS — W57XXXA Bitten or stung by nonvenomous insect and other nonvenomous arthropods, initial encounter: Secondary | ICD-10-CM | POA: Insufficient documentation

## 2018-02-08 DIAGNOSIS — S70362A Insect bite (nonvenomous), left thigh, initial encounter: Secondary | ICD-10-CM

## 2018-02-08 DIAGNOSIS — M2550 Pain in unspecified joint: Secondary | ICD-10-CM | POA: Diagnosis not present

## 2018-02-08 DIAGNOSIS — W57XXXD Bitten or stung by nonvenomous insect and other nonvenomous arthropods, subsequent encounter: Secondary | ICD-10-CM | POA: Diagnosis not present

## 2018-02-08 NOTE — Assessment & Plan Note (Signed)
Does have family h/o OA which is more likely the cause but reasonable to check tick borne illness titers given history of tick bite. She was treated with 14 day course of doxycyline.  Orders Placed This Encounter  Procedures  . B. burgdorfi antibodies  . Rocky mtn spotted fvr abs pnl(IgG+IgM)

## 2018-02-08 NOTE — Patient Instructions (Signed)
Great to see you. I will call you with your lab results from today and you can view them online.

## 2018-02-08 NOTE — Progress Notes (Signed)
Subjective:   Patient ID: Alexis Hoffman, female    DOB: 11-18-1970, 47 y.o.   MRN: [id number]  Alexis Hoffman is a pleasant 47 y.o. year old female who presents to clinic today with Insect Bite (Patient is here today C/O a tick bie on left leg on inner thigh a month ago.  Within 24 hours of removing the tick had a bulls-eye rash, hot to touch.  Went to [facility].  It took her about 20 minutes to get the head of it out.  She was Tx with Doxy 136m bid x14d.  As soon as she stopped it the place swelled up and got red again and itching for a few days then went away.  She has joint pain in fingers and left knee.  Is requesting testing to be done for Lyme's Dz. )  on 02/08/2018  HPI:  Tick bite- a month ago, removed a tick from her inner thigh.  Within 24 hours of removing it, had a very large, target lesion around where the tick was embedded and it was hot to touch.  Does not think the tick was embedded more than 12 hours- it was not engorged.  [facility].  [facility] the head of the tick and placed her on 14 days of doxycyline 100 mg twice daily.  Rash resolved and has had no further rashes. Has had some achy joints- hands and knees.  Not worse in morning or night.  Intermittent. Not warm or red. Current Outpatient Medications on File Prior to Visit  Medication Sig Dispense Refill  . bisoprolol (ZEBETA) 10 MG tablet Take 1 tablet (10 mg total) by mouth daily. 90 tablet 3   Current Facility-Administered Medications on File Prior to Visit  Medication Dose Route Frequency Provider Last Rate Last Dose  . penicillin G benzathine (BICILLIN-LA) 600000 UNIT/ML injection 600,000 Units  600,000 Units Intramuscular Once ALucille Passy MD        Allergies  Allergen Reactions  . Codeine Nausea And Vomiting  . Vicodin [Hydrocodone-Acetaminophen] Hives    Past Medical History:  Diagnosis Date  . Hypertension     Past Surgical History:  Procedure  Laterality Date  . ABLATION    . CESAREAN SECTION    . CHOLECYSTECTOMY    . TUBAL LIGATION      Family History  Problem Relation Age of Onset  . Arthritis Mother   . COPD Father   . Hypertension Father     Social History   Socioeconomic History  . Marital status: Married    Spouse name: Not on file  . Number of children: Not on file  . Years of education: Not on file  . Highest education level: Not on file  Occupational History  . Not on file  Social Needs  . Financial resource strain: Not on file  . Food insecurity:    Worry: Not on file    Inability: Not on file  . Transportation needs:    Medical: Not on file    Non-medical: Not on file  Tobacco Use  . Smoking status: Never Smoker  . Smokeless tobacco: Never Used  Substance and Sexual Activity  . Alcohol use: No    Comment: glass of wine at night  . Drug use: No  . Sexual activity: Yes    Birth control/protection: Surgical    Comment: Ablation   Lifestyle  . Physical activity:    Days per week:  Not on file    Minutes per session: Not on file  . Stress: Not on file  Relationships  . Social connections:    Talks on phone: Not on file    Gets together: Not on file    Attends religious service: Not on file    Active member of club or organization: Not on file    Attends meetings of clubs or organizations: Not on file    Relationship status: Not on file  . Intimate partner violence:    Fear of current or ex partner: Not on file    Emotionally abused: Not on file    Physically abused: Not on file    Forced sexual activity: Not on file  Other Topics Concern  . Not on file  Social History Narrative  . Not on file   The PMH, PSH, Social History, Family History, Medications, and allergies have been reviewed in [facility], and have been updated if relevant.   Review of Systems  Constitutional: Negative.   HENT: Negative.   Respiratory: Negative.   Cardiovascular: Negative.   Gastrointestinal: Negative.     Musculoskeletal: Positive for arthralgias.  Skin: Negative.   Neurological: Negative.   Hematological: Negative.   Psychiatric/Behavioral: Negative.   All other systems reviewed and are negative.      Objective:    BP 126/84 (BP Location: Left Arm, Patient Position: Sitting, Cuff Size: Normal)   Pulse 78   Temp 98.6 F (37 C) (Oral)   Ht 5' 7"  (1.702 m)   Wt 218 lb 6.4 oz (99.1 kg)   SpO2 98%   BMI 34.21 kg/m    Physical Exam    General:  Well-developed,well-nourished,in no acute distress; alert,appropriate and cooperative throughout examination Head:  normocephalic and atraumatic.   Eyes:  vision grossly intact Ears:  R ear normal and L ear normal externally Nose:  no external deformity.   Mouth:  good dentition.   Neck:  No deformities, masses, or tenderness noted. Lungs:  Normal respiratory effort, chest expands symmetrically. Lungs are clear to auscultation, no crackles or wheezes. Heart:  Normal rate and regular rhythm. S1 and S2 normal without gallop, murmur, click, rub or other extra sounds Msk:  No deformity or scoliosis noted of thoracic or lumbar spine.   Extremities:  No clubbing, cyanosis, edema, or deformity noted with normal full range of motion of all joints.   Neurologic:  alert & oriented X3 and gait normal.   Skin:  Intact without suspicious lesions or rashes Psych:  Cognition and judgment appear intact. Alert and cooperative with normal attention span and concentration. No apparent delusions, illusions, hallucinations      Assessment & Plan:   Tick bite, subsequent encounter - Plan: B. burgdorfi antibodies, Rocky mtn spotted fvr abs pnl(IgG+IgM)  Arthralgia, unspecified joint - Plan: B. burgdorfi antibodies, Rocky mtn spotted fvr abs pnl(IgG+IgM) No follow-ups on file.

## 2018-02-09 LAB — B. BURGDORFI ANTIBODIES: B burgdorferi Ab IgG+IgM: <0.9 index

## 2018-02-09 LAB — ROCKY MTN SPOTTED FVR ABS PNL(IGG+IGM)
RMSF IgG: NOT DETECTED
RMSF IgM: NOT DETECTED

## 2018-04-05 ENCOUNTER — Other Ambulatory Visit: Payer: Self-pay

## 2018-04-05 ENCOUNTER — Encounter: Payer: Self-pay | Admitting: Family Medicine

## 2018-04-05 MED ORDER — BISOPROLOL FUMARATE 10 MG PO TABS: 10.0000 mg | ORAL_TABLET | Freq: Every day | ORAL | 3 refills | Status: DC

## 2018-11-11 IMAGING — US US PELVIS COMPLETE
1 series · 13 of 25 positions shown · non-contrast
Comparison: April 03, 2017 and April 25, 2017

CLINICAL DATA: Abdominal distension. Patient "Feels baby moving".
Quantitative beta-hCG is 0.02.

EXAM:
TRANSABDOMINAL AND TRANSVAGINAL ULTRASOUND OF PELVIS
TECHNIQUE: Both transabdominal and transvaginal ultrasound examinations of the
pelvis were performed. Transabdominal technique was performed for
global imaging of the pelvis including uterus, ovaries, adnexal
regions, and pelvic cul-de-sac. It was necessary to proceed with
endovaginal exam following the transabdominal exam to visualize the
endometrium and ovaries.

[Series 1: us pelvis complete · 0.26mm/px · 75 acquisitions, 13 frames shown]
[im 1/75]
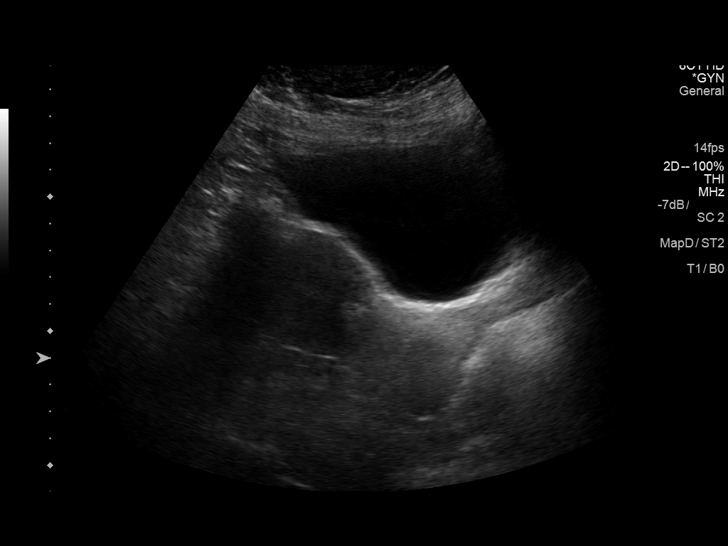
[im 7/75]
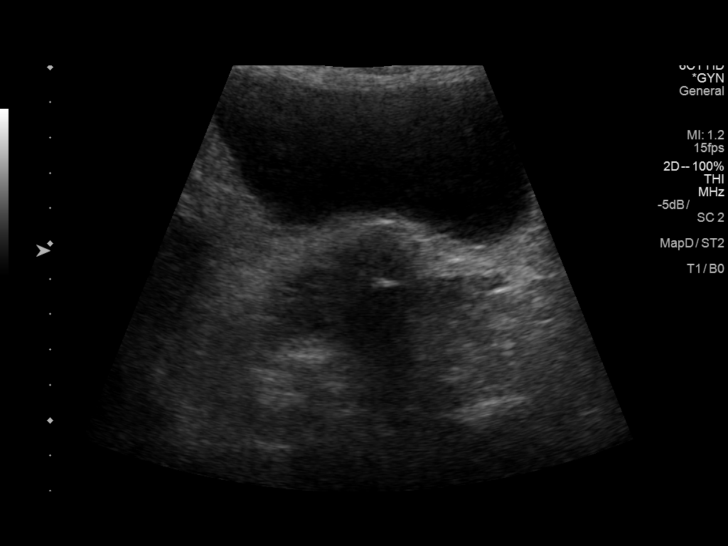
[im 13/75]
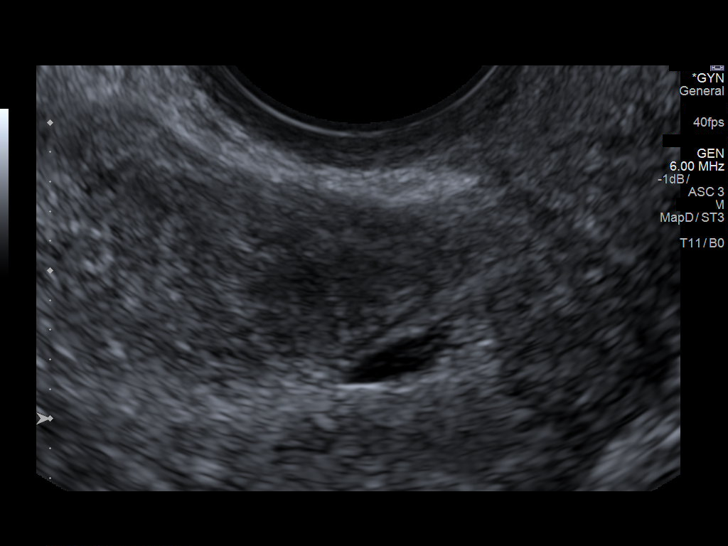
[im 19/75]
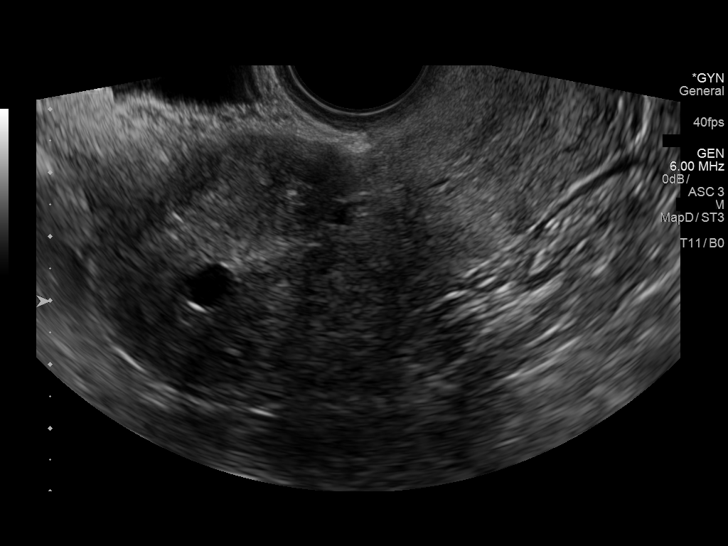
[im 25/75]
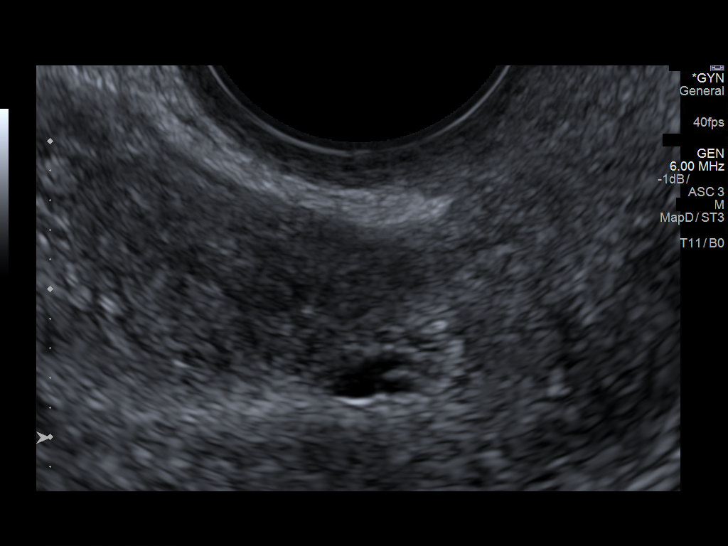
[im 31/75]
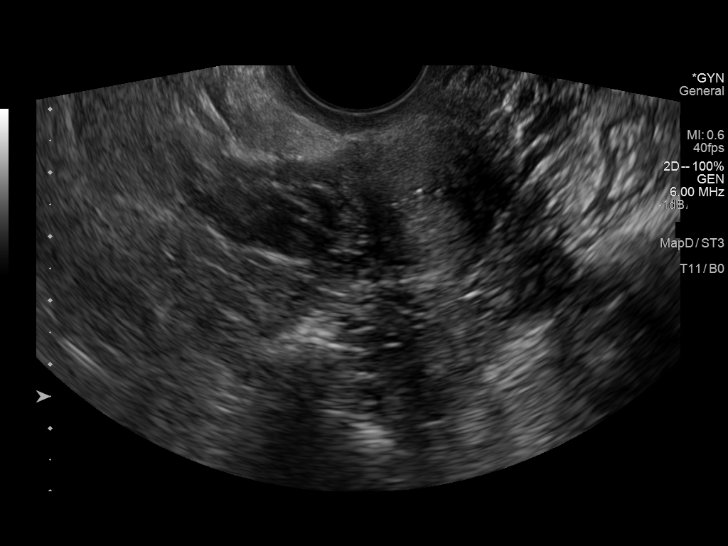
[im 38/75]
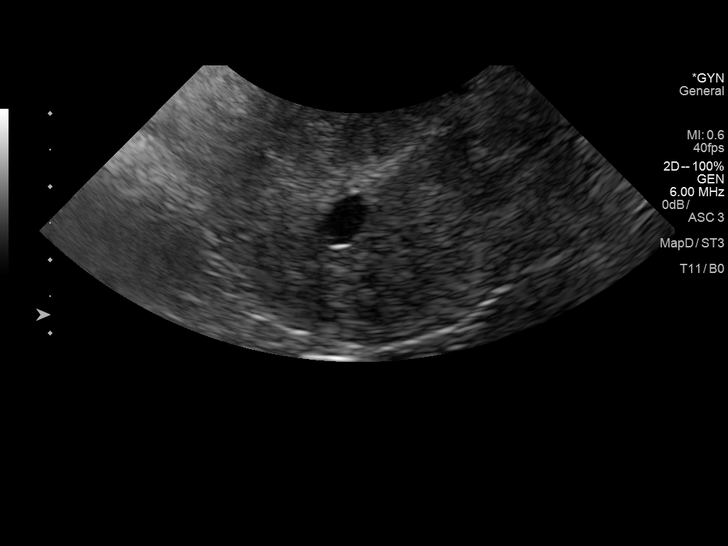
[im 44/75]
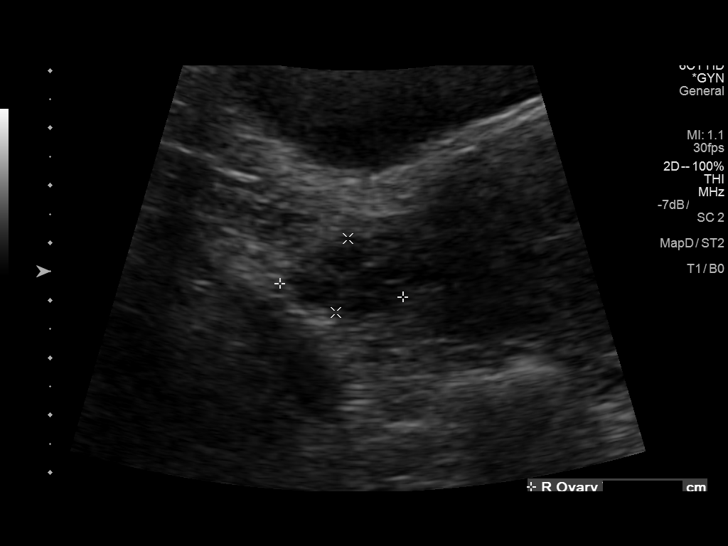
[im 50/75]
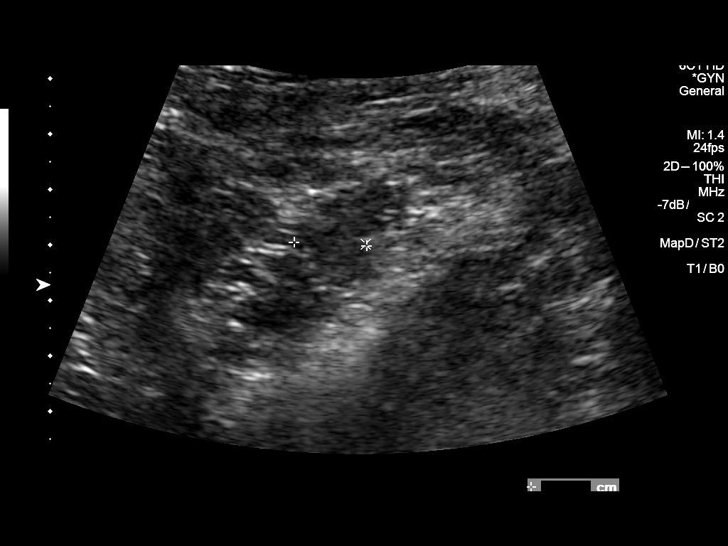
[im 56/75]
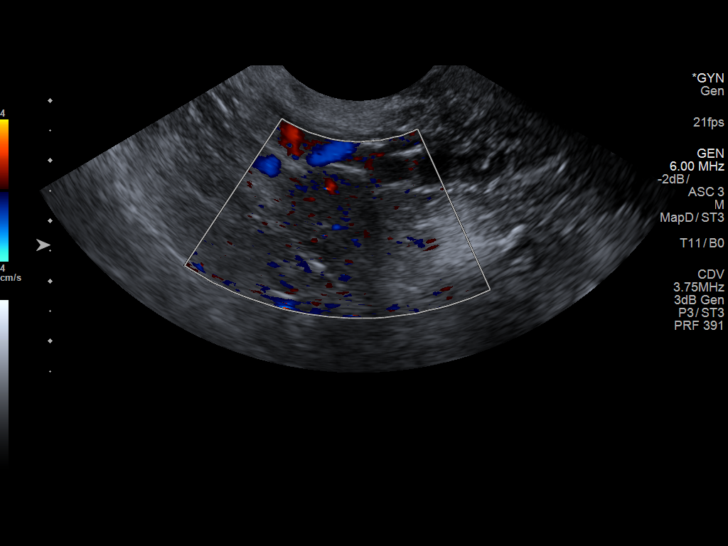
[im 62/75]
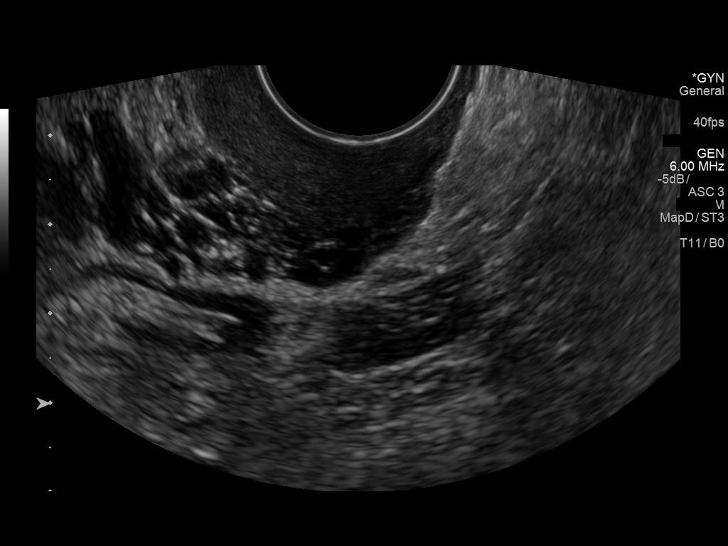
[im 68/75]
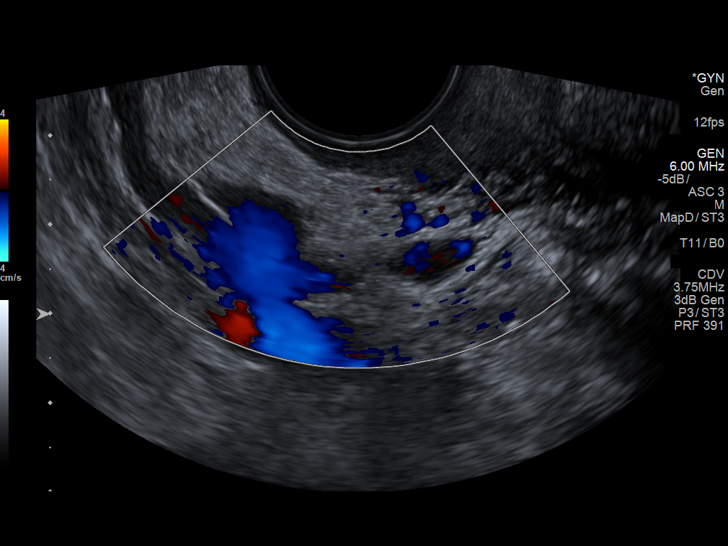
[im 75/75]
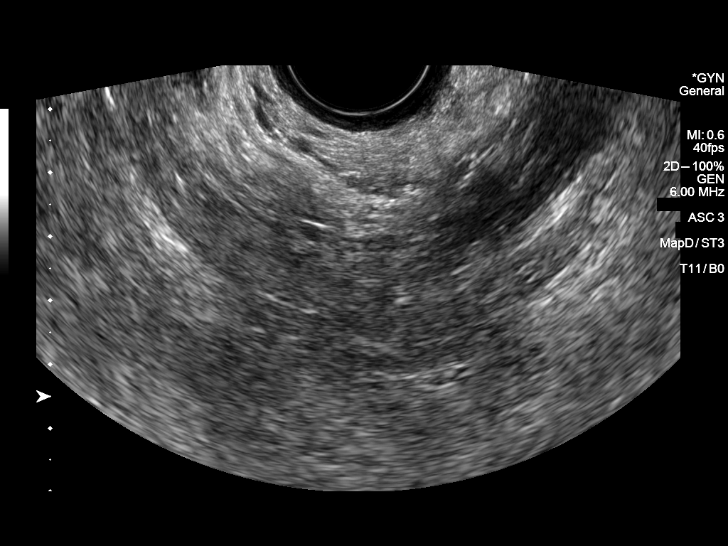

[13 of 25 positions shown; findings below may reference images not displayed]

FINDINGS: Uterus

Measurements: 8.7 x 3.7 x 5.1 cm. No fibroids or other mass
visualized.

Endometrium

Thickness: 4.6 mm. There are 2 cystic structures associated with the
endometrium. The first in the lower uterine segment measures 10 x 3
x 6 mm today. This was seen on the April 03, 2017 ultrasound but was
not measured. The second appears to arise from the posterior aspect
of the fundal endometrium measuring 5 x 8 x 7 mm. This was not
present on the March 2017 ultrasound.

Right ovary

Measurements: 1.9 x 2.3 x 1.5 cm. Normal appearance/no adnexal mass.

Left ovary

Measurements: 1.8 x 0.9 x 1.3 cm. Normal appearance/no adnexal mass.

Other findings

No abnormal free fluid. The vessels in the right adnexae are mildly
prominent. This is a nonspecific finding.
IMPRESSION: Two cystic regions are seen within the endometrium. The first was
present in Sunday March, 2017 within the lower uterine segment but was not
measured at that time. The second is new since March 2017. The
findings are not specific. Recommend gynecologic consultation. This
could be followed in 6 weeks with ultrasound for further assessment.
Alternatively, the patient may benefit from a hystero sonography. No
other significant abnormalities.

## 2019-02-18 ENCOUNTER — Encounter: Payer: Self-pay | Admitting: Family Medicine

## 2019-03-01 ENCOUNTER — Telehealth: Payer: Self-pay

## 2019-03-01 NOTE — Telephone Encounter (Signed)
Questions for Screening COVID-19  Symptom onset: None  Travel or Contacts: Non3  During this illness, did/does the patient experience any of the following symptoms? Fever >100.67F []   Yes [x]   No []   Unknown Subjective fever (felt feverish) []   Yes [x]   No []   Unknown Chills []   Yes [x]   No []   Unknown Muscle aches (myalgia) []   Yes [x]   No []   Unknown Runny nose (rhinorrhea) []   Yes [x]   No []   Unknown Sore throat []   Yes [x]   No []   Unknown Cough (new onset or worsening of chronic cough) []   Yes [x]   No []   Unknown Shortness of breath (dyspnea) []   Yes [x]   No []   Unknown Nausea or vomiting []   Yes [x]   No []   Unknown Headache []   Yes [x]   No []   Unknown Abdominal pain  []   Yes [x]   No []   Unknown Diarrhea (?3 loose/looser than normal stools/24hr period) []   Yes [x]   No []   Unknown Other, specify:  Patient risk factors: Smoker? []   Current []   Former []   Never If female, currently pregnant? []   Yes []   No  Patient Active Problem List   Diagnosis Date Noted  . Tick bite 02/08/2018  . Arthralgia 02/08/2018  . Nausea 05/09/2017  . Abdominal distension 04/24/2017  . Costochondritis 01/06/2015  . HYPERTRIGLYCERIDEMIA 06/15/2010  . COMMON MIGRAINE 06/15/2010  . Essential hypertension 06/15/2010  . ALLERGIC RHINITIS 06/15/2010  . Asthma 06/15/2010  . GASTRIC ULCER, HX OF 06/15/2010  . CHICKENPOX, HX OF 06/15/2010    Plan:  []   High risk for COVID-19 with red flags go to ED (with CP, SOB, weak/lightheaded, or fever > 101.5). Call ahead.  []   High risk for COVID-19 but stable. Inform provider and coordinate time for [facility] visit.   []   No red flags but URI signs or symptoms okay for [facility] visit.

## 2019-03-04 ENCOUNTER — Encounter: Payer: Self-pay | Admitting: Family Medicine

## 2019-03-04 ENCOUNTER — Ambulatory Visit: Payer: BC Managed Care – PPO | Admitting: Family Medicine

## 2019-03-04 VITALS — BP 122/84 | HR 88 | Temp 98.5°F | Ht 67.0 in | Wt 224.0 lb

## 2019-03-04 DIAGNOSIS — G43009 Migraine without aura, not intractable, without status migrainosus: Secondary | ICD-10-CM | POA: Diagnosis not present

## 2019-03-04 DIAGNOSIS — I10 Essential (primary) hypertension: Secondary | ICD-10-CM

## 2019-03-04 DIAGNOSIS — Z91018 Allergy to other foods: Secondary | ICD-10-CM | POA: Diagnosis not present

## 2019-03-04 MED ORDER — HYDROCHLOROTHIAZIDE 12.5 MG PO CAPS: 12.5000 mg | ORAL_CAPSULE | Freq: Every day | ORAL | 0 refills | Status: DC

## 2019-03-04 MED ORDER — SUMATRIPTAN SUCCINATE 25 MG PO TABS: 25.0000 mg | ORAL_TABLET | ORAL | 0 refills | Status: DC | PRN

## 2019-03-04 MED ORDER — TOPIRAMATE 50 MG PO TABS: 50.0000 mg | ORAL_TABLET | Freq: Every day | ORAL | 3 refills | Status: DC

## 2019-03-04 NOTE — Progress Notes (Signed)
Subjective:   Patient ID: Alexis Hoffman, female    DOB: 03/08/1971, 48 y.o.   MRN: [id number]  Alexis Hoffman is a pleasant 48 y.o. year old female who presents to clinic today with Hypertension (Pt is here today to discuss alternative Tx for BP tother than Bisoprolol due to developing a wheat allergy.  The allergist said that due to this the epi-pen will be ineffective.) and Migraine  on 03/04/2019  HPI: HTN- Has been well controlled on bisoprolol for years.  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  She has never been on any other class of antihypertensive as it as used for migraine prophlaxis along with HTN.   Migraines- Associated with sensitivity to light and vomiting. Having at last four migraines per month now and is concerned that they will get worse when she stops her beta blocker. She was on topamax 15 years ago for preventative therapy but dos not remember if it worked or not.  Has never tried any other rx  for prevention.  Has only tried excedrin for abortive therapy.  Has never taken a triptan for abortive therapy.  Current Outpatient Medications on File Prior to Visit  Medication Sig Dispense Refill  . Azelastine-Fluticasone 137-50 MCG/ACT SUSP     . EPINEPHrine 0.3 mg/0.3 mL IJ SOAJ injection USE AS NEEDED FOR ANAPHYLAXIS     Current Facility-Administered Medications on File Prior to Visit  Medication Dose Route Frequency Provider Last Rate Last Dose  . penicillin G benzathine (BICILLIN-LA) 600000 UNIT/ML injection 600,000 Units  600,000 Units Intramuscular Once Lucille Passy, MD        Allergies  Allergen Reactions  . Codeine Nausea And Vomiting  . Vicodin [Hydrocodone-Acetaminophen] Hives  . Wheat Bran     Past Medical History:  Diagnosis Date  . Hypertension     Past Surgical History:  Procedure Laterality Date  . ABLATION    . CESAREAN SECTION    . CHOLECYSTECTOMY    . TUBAL LIGATION       Family History  Problem Relation Age of Onset  . Arthritis Mother   . COPD Father   . Hypertension Father     Social History   Socioeconomic History  . Marital status: Married    Spouse name: Not on file  . Number of children: Not on file  . Years of education: Not on file  . Highest education level: Not on file  Occupational History  . Not on file  Social Needs  . Financial resource strain: Not on file  . Food insecurity    Worry: Not on file    Inability: Not on file  . Transportation needs    Medical: Not on file    Non-medical: Not on file  Tobacco Use  . Smoking status: Never Smoker  . Smokeless tobacco: Never Used  Substance and Sexual Activity  . Alcohol use: No    Comment: glass of wine at night  . Drug use: No  . Sexual activity: Yes    Birth control/protection: Surgical    Comment: Ablation   Lifestyle  . Physical activity    Days per week: Not on file    Minutes per session: Not on file  . Stress: Not on file  Relationships  . Social Herbalist on phone: Not on file    Gets together: Not on file    Attends religious service: Not on  file    Active member of club or organization: Not on file    Attends meetings of clubs or organizations: Not on file    Relationship status: Not on file  . Intimate partner violence    Fear of current or ex partner: Not on file    Emotionally abused: Not on file    Physically abused: Not on file    Forced sexual activity: Not on file  Other Topics Concern  . Not on file  Social History Narrative  . Not on file   The PMH, PSH, Social History, Family History, Medications, and allergies have been reviewed in [facility], and have been updated if relevant.   Review of Systems  Constitutional: Negative.   HENT: Negative.   Eyes: Positive for photophobia.  Respiratory: Negative.  Negative for shortness of breath.   Cardiovascular: Positive for leg swelling. Negative for chest pain and palpitations.   Gastrointestinal: Positive for nausea.  Genitourinary: Negative.   Musculoskeletal: Negative.   Neurological: Positive for headaches. Negative for tremors, seizures, syncope, speech difficulty, weakness, light-headedness and numbness.  Hematological: Negative.   Psychiatric/Behavioral: Negative.   All other systems reviewed and are negative.      Objective:    BP 122/84 (BP Location: Left Arm, Patient Position: Sitting, Cuff Size: Normal)   Pulse 88   Temp 98.5 F (36.9 C) (Oral)   Ht 5' 7"  (1.702 m)   Wt 224 lb (101.6 kg)   SpO2 99%   BMI 35.08 kg/m    Physical Exam Vitals signs and nursing note reviewed.  Constitutional:      Appearance: Normal appearance.  HENT:     Head: Normocephalic and atraumatic.     Right Ear: External ear normal.     Left Ear: External ear normal.  Neck:     Musculoskeletal: Normal range of motion.  Cardiovascular:     Rate and Rhythm: Normal rate and regular rhythm.  Pulmonary:     Effort: Pulmonary effort is normal.     Breath sounds: Normal breath sounds.  Musculoskeletal:        General: Swelling present.  Skin:    General: Skin is warm and dry.  Neurological:     General: No focal deficit present.     Mental Status: She is alert and oriented to person, place, and time.  Psychiatric:        Mood and Affect: Mood normal.        Behavior: Behavior normal.        Thought Content: Thought content normal.        Judgment: Judgment normal.           Assessment & Plan:   Migraine without aura and without status migrainosus, not intractable - Plan:   Essential hypertension - Plan:   Wheat allergy - Plan:  No follow-ups on file.

## 2019-03-04 NOTE — Assessment & Plan Note (Signed)
Discussed tx options.  Elavil could interfere with effectiveness of epi pen too. Will start topamax- 25- 50 mg qhs. She will keep a headache journal and update me. The patient indicates understanding of these issues and agrees with the plan.

## 2019-03-04 NOTE — Patient Instructions (Addendum)
Great to see you.  Stop taking bisoprolol. Go ahead HCTZ 12.5 every morning. Check your blood pressure once or twice daily sometimes, sometimes in the evening, or if you have a headache.  Start topamax 50 mg nightly- okay to cut in half and take 25 mg nightly for 3- 10 days.  Imitrex to be for a migraine the moment you feel coming on.  Please either sent my your blood pressure log on my chart or contact me sooner with any concerns or issues.  I will call you with your lab results from today and you can view them online.

## 2019-03-04 NOTE — Assessment & Plan Note (Signed)
>  40 minutes spent in face to face time with patient, >50% spent in counselling or coordination of care discussing HTN, migraines, changing BP rxs to one on that does not alter effectiveness of epi pen.  D/c bisoprolol.  Start HCTZ 12.5 mg every morning- she will check BP once or twice a day (see AVS) and send me daily readings.  Labs today.  The patient indicates understanding of these issues and agrees with the plan.'

## 2019-03-05 LAB — CBC WITH DIFFERENTIAL/PLATELET
Absolute Monocytes: 647 cells/uL (ref 200–950)
Basophils Absolute: 23 cells/uL (ref 0–200)
Basophils Relative: 0.3 %
Eosinophils Absolute: 187 cells/uL (ref 15–500)
Eosinophils Relative: 2.4 %
HCT: 42.7 % (ref 35.0–45.0)
Hemoglobin: 14.5 g/dL (ref 11.7–15.5)
Lymphs Abs: 1911 cells/uL (ref 850–3900)
MCH: 30.8 pg (ref 27.0–33.0)
MCHC: 34 g/dL (ref 32.0–36.0)
MCV: 90.7 fL (ref 80.0–100.0)
MPV: 11.2 fL (ref 7.5–12.5)
Monocytes Relative: 8.3 %
Neutro Abs: 5031 cells/uL (ref 1500–7800)
Neutrophils Relative %: 64.5 %
Platelets: 258 10*3/uL (ref 140–400)
RBC: 4.71 10*6/uL (ref 3.80–5.10)
RDW: 12.8 % (ref 11.0–15.0)
Total Lymphocyte: 24.5 %
WBC: 7.8 10*3/uL (ref 3.8–10.8)

## 2019-03-05 LAB — COMPREHENSIVE METABOLIC PANEL
AG Ratio: 1.6 (calc) (ref 1.0–2.5)
ALT: 18 U/L (ref 6–29)
AST: 14 U/L (ref 10–35)
Albumin: 4.1 g/dL (ref 3.6–5.1)
Alkaline phosphatase (APISO): 73 U/L (ref 31–125)
BUN: 12 mg/dL (ref 7–25)
CO2: 25 mmol/L (ref 20–32)
Calcium: 9.1 mg/dL (ref 8.6–10.2)
Chloride: 107 mmol/L (ref 98–110)
Creat: 0.82 mg/dL (ref 0.50–1.10)
Globulin: 2.5 g/dL (calc) (ref 1.9–3.7)
Glucose, Bld: 124 mg/dL — ABNORMAL HIGH (ref 65–99)
Potassium: 4.3 mmol/L (ref 3.5–5.3)
Sodium: 139 mmol/L (ref 135–146)
Total Bilirubin: 0.5 mg/dL (ref 0.2–1.2)
Total Protein: 6.6 g/dL (ref 6.1–8.1)

## 2019-03-05 LAB — LIPID PANEL
Cholesterol: 287 mg/dL — ABNORMAL HIGH (ref <200)
HDL: 34 mg/dL — ABNORMAL LOW (ref >=50)
Non-HDL Cholesterol (Calc): 253 mg/dL (calc) — ABNORMAL HIGH (ref <130)
Total CHOL/HDL Ratio: 8.4 (calc) — ABNORMAL HIGH (ref <5.0)
Triglycerides: 553 mg/dL — ABNORMAL HIGH (ref <150)

## 2019-03-05 LAB — TSH: TSH: 0.76 mIU/L

## 2019-03-07 ENCOUNTER — Other Ambulatory Visit: Payer: Self-pay

## 2019-03-07 ENCOUNTER — Encounter: Payer: Self-pay | Admitting: Family Medicine

## 2019-03-07 DIAGNOSIS — E781 Pure hyperglyceridemia: Secondary | ICD-10-CM

## 2019-03-09 ENCOUNTER — Encounter: Payer: Self-pay | Admitting: Family Medicine

## 2019-03-11 ENCOUNTER — Encounter: Payer: Self-pay | Admitting: Family Medicine

## 2019-03-19 ENCOUNTER — Encounter: Payer: Self-pay | Admitting: Family Medicine

## 2019-03-20 ENCOUNTER — Telehealth: Payer: Self-pay | Admitting: Family Medicine

## 2019-03-20 ENCOUNTER — Other Ambulatory Visit (INDEPENDENT_AMBULATORY_CARE_PROVIDER_SITE_OTHER): Payer: BC Managed Care – PPO | Admitting: Family Medicine

## 2019-03-20 DIAGNOSIS — I10 Essential (primary) hypertension: Secondary | ICD-10-CM

## 2019-03-20 MED ORDER — LISINOPRIL-HYDROCHLOROTHIAZIDE 20-12.5 MG PO TABS: 1.0000 | ORAL_TABLET | Freq: Every day | ORAL | 3 refills | Status: DC

## 2019-03-20 NOTE — Progress Notes (Signed)
[facility] VISIT   Patient agreed to [facility] visit and is aware that copayment and coinsurance may apply. Patient was treated using telemedicine according to accepted telemedicine protocols.  Subjective:   Patient complains of elevated blood pressure and pulse.   Patient Active Problem List   Diagnosis Date Noted  . 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 Arthralgia 02/08/2018  . Nausea 05/09/2017  . HYPERTRIGLYCERIDEMIA 06/15/2010  . Migraine without aura 06/15/2010  . Essential hypertension 06/15/2010  . ALLERGIC RHINITIS 06/15/2010  . Asthma 06/15/2010  . GASTRIC ULCER, HX OF 06/15/2010  . CHICKENPOX, HX OF 06/15/2010   Social History   Tobacco Use  . Smoking status: Never Smoker  . Smokeless tobacco: Never Used  Substance Use Topics  . Alcohol use: No    Comment: glass of wine at night    Current Outpatient Medications:  .  Azelastine-Fluticasone 137-50 MCG/ACT SUSP, , Disp: , Rfl:  .  EPINEPHrine 0.3 mg/0.3 mL IJ SOAJ injection, USE AS NEEDED FOR ANAPHYLAXIS, Disp: , Rfl:  .  lisinopril-hydrochlorothiazide (ZESTORETIC) 20-12.5 MG tablet, Take 1 tablet by mouth daily., Disp: 30 tablet, Rfl: 3 .  SUMAtriptan (IMITREX) 25 MG tablet, Take 1 tablet (25 mg total) by mouth every 2 (two) hours as needed for migraine. May repeat in 2 hours if headache persists or recurs., Disp: 10 tablet, Rfl: 0 .  topiramate (TOPAMAX) 50 MG tablet, Take 1 tablet (50 mg total) by mouth at bedtime., Disp: 30 tablet, Rfl: 3  Allergies  Allergen Reactions  . Codeine Nausea And Vomiting  . Vicodin [Hydrocodone-Acetaminophen] Hives  . Wheat Bran     Assessment and Plan:   Diagnosis: HTN, migraines . Please see myChart communication and orders below.   No orders of the defined types were placed in this encounter.  Meds ordered this encounter  Medications  . lisinopril-hydrochlorothiazide (ZESTORETIC) 20-12.5 MG tablet    Sig: Take 1 tablet by mouth daily.    Dispense:  30 tablet    Refill:  3     Arnette Norris, MD 03/20/2019  A total of 5 minutes were spent by me to personally review the patient-generated inquiry, review patient records and data pertinent to assessment of the patient's problem, develop a management plan including generation of prescriptions and/or orders, and on subsequent communication with the patient through secure the MyChart portal service.   There is no separately reported E/M service related to this service in the past 7 days nor does the patient have an upcoming soonest available appointment for this issue. This work was completed in less than 7 days.   The patient consented to this service today (see patient agreement prior to ongoing communication). Patient counseled regarding the need for in-person exam for certain conditions and was advised to call the office if any changing or worsening symptoms occur.   The codes to be used for the E/M service are: [x]   99421 for 5-10 minutes of time spent on the inquiry. []   [id number] for 11-20 minutes. []   99423 for 21+ minutes.

## 2019-03-22 ENCOUNTER — Telehealth: Payer: Self-pay

## 2019-03-22 NOTE — Telephone Encounter (Signed)
  During this illness, did/does the patient experience any of the following symptoms? Fever >100.68F []   Yes [x]   No []   Unknown Subjective fever (felt feverish) []   Yes [x]   No []   Unknown Chills []   Yes [x]   No []   Unknown Muscle aches (myalgia) []   Yes [x]   No []   Unknown Runny nose (rhinorrhea) []   Yes [x]   No []   Unknown Sore throat []   Yes [x]   No []   Unknown Cough (new onset or worsening of chronic cough) []   Yes [x]   No []   Unknown Shortness of breath (dyspnea) []   Yes [x]   No []   Unknown Nausea or vomiting []   Yes [x]   No []   Unknown Headache []   Yes [x]   No []   Unknown Abdominal pain  []   Yes [x]   No []   Unknown Diarrhea (?3 loose/looser than normal stools/24hr period) []   Yes [x]   No []   Unknown Other, specify:  ADVISED PT TO CALL OFFICE BEFORE COMING IN IF ANY OF THESE SYMPTOMS APPEAR OVER THE WEEKEND.

## 2019-03-25 ENCOUNTER — Other Ambulatory Visit (INDEPENDENT_AMBULATORY_CARE_PROVIDER_SITE_OTHER): Payer: BC Managed Care – PPO

## 2019-03-25 DIAGNOSIS — E781 Pure hyperglyceridemia: Secondary | ICD-10-CM

## 2019-03-25 LAB — LIPID PANEL
Cholesterol: 199 mg/dL (ref 0–200)
HDL: 37.5 mg/dL — ABNORMAL LOW (ref >39.00)
NonHDL: 161.38
Total CHOL/HDL Ratio: 5
Triglycerides: 277 mg/dL — ABNORMAL HIGH (ref 0.0–149.0)
VLDL: 55.4 mg/dL — ABNORMAL HIGH (ref 0.0–40.0)

## 2019-03-25 LAB — LDL CHOLESTEROL, DIRECT: Direct LDL: 66 mg/dL

## 2019-03-26 ENCOUNTER — Encounter: Payer: Self-pay | Admitting: Family Medicine

## 2019-03-26 ENCOUNTER — Other Ambulatory Visit: Payer: Self-pay | Admitting: Family Medicine

## 2019-03-27 ENCOUNTER — Other Ambulatory Visit: Payer: Self-pay | Admitting: Family Medicine

## 2019-04-04 ENCOUNTER — Encounter: Payer: Self-pay | Admitting: Family Medicine

## 2019-06-12 ENCOUNTER — Encounter: Payer: Self-pay | Admitting: Family Medicine

## 2019-06-12 ENCOUNTER — Telehealth (INDEPENDENT_AMBULATORY_CARE_PROVIDER_SITE_OTHER): Payer: BC Managed Care – PPO | Admitting: Family Medicine

## 2019-06-12 DIAGNOSIS — R05 Cough: Secondary | ICD-10-CM

## 2019-06-12 DIAGNOSIS — R059 Cough, unspecified: Secondary | ICD-10-CM

## 2019-06-12 MED ORDER — HYDROCHLOROTHIAZIDE 25 MG PO TABS: 25.0000 mg | ORAL_TABLET | Freq: Every day | ORAL | 3 refills | Status: AC

## 2019-06-12 NOTE — Progress Notes (Signed)
   TELEPHONE ENCOUNTER   Patient verbally agreed to telephone visit and is aware that copayment and coinsurance may apply. Patient was treated using telemedicine according to accepted telemedicine protocols.  Location of the patient: patient's home           Location of provider: provider's office  Names of all persons participating in the telemedicine service and role in the encounter: Arnette Norris, MD Fermin Schwab  Subjective:   Chief Complaint  Patient presents with  . Cough     HPI   Cough for several months-  Related to allergies and asthma.  It's worse now that she is always at home- thinks it is due to her dog.  Cough started in July which is around the time we stopped her Bystolic per her allergist's requests and started lisinopril HCTZ.  BP Readings from Last 3 Encounters:  03/04/19 122/84  02/08/18 126/84  08/21/17 124/86     It's a dry, nagging cough.  Worse when she talks.   No fevers, chills or sputum production.     Patient Active Problem List   Diagnosis Date Noted  . 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 Arthralgia 02/08/2018  . Nausea 05/09/2017  . HYPERTRIGLYCERIDEMIA 06/15/2010  . Migraine without aura 06/15/2010  . Essential hypertension 06/15/2010  . ALLERGIC RHINITIS 06/15/2010  . Asthma 06/15/2010  . GASTRIC ULCER, HX OF 06/15/2010  . CHICKENPOX, HX OF 06/15/2010   Social History   Tobacco Use  . Smoking status: Never Smoker  . Smokeless tobacco: Never Used  Substance Use Topics  . Alcohol use: No    Comment: glass of wine at night    Current Outpatient Medications:  .  Azelastine-Fluticasone 137-50 MCG/ACT SUSP, , Disp: , Rfl:  .  EPINEPHrine 0.3 mg/0.3 mL IJ SOAJ injection, USE AS NEEDED FOR ANAPHYLAXIS, Disp: , Rfl:  .  lisinopril-hydrochlorothiazide (ZESTORETIC) 20-12.5 MG tablet, Take 1 tablet by mouth daily., Disp: 30 tablet, Rfl: 3 .  SUMAtriptan (IMITREX) 25 MG tablet, TAKE 1 TABLET BY MOUTH AT ONSET OF MIGRAINE. MAY REPEAT IN 2  HOURS IF HEADACHE PERSISTS OR RECURS., Disp: 10 tablet, Rfl: 0 .  topiramate (TOPAMAX) 50 MG tablet, Take 1 tablet (50 mg total) by mouth at bedtime., Disp: 30 tablet, Rfl: 3 Allergies  Allergen Reactions  . Codeine Nausea And Vomiting  . Vicodin [Hydrocodone-Acetaminophen] Hives  . Wheat Bran     Assessment & Plan:   No diagnosis found.  No orders of the defined types were placed in this encounter.  No orders of the defined types were placed in this encounter.   Arnette Norris, MD 06/12/2019  Time spent with the patient: 21 minutes, spent in obtaining information about her symptoms, reviewing her previous labs, evaluations, and treatments, counseling her about her condition (please see the discussed topics above), and developing a plan to further investigate it; she had a number of questions which I addressed.   [id number] physician/qualified health professional telephone evaluation 5 to 10 minutes [id number] physician/qualified help functional [place] evaluation for 11 to 20 minutes [id number] physician/qualify he will professional telephone evaluation for 21 to 30 minutes

## 2019-06-12 NOTE — Assessment & Plan Note (Signed)
New since July- ? Due to ACEI.  Advised the following:  Please stop taking your lisinopril- hydrochlorothiazide 20-12.5 mg daily. Start taking Hydrochlorothiazide 25 mg daily.  Please update me with your blood pressures readings and cough of the next several weeks. Increase your intake of dietary potassium as well.  I do want you to follow up with me for labs and a blood pressure check in a few weeks as well.  Potassium rich foods include:    ? Nuts, such as peanuts and pistachios. ? Seeds, such as sunflower seeds and pumpkin seeds. ? Peas, lentils, and lima beans. ? Whole grain and bran cereals and breads. ? Fresh fruits and vegetables, such as apricots, avocado, bananas, cantaloupe, kiwi, oranges, tomatoes, asparagus, and potatoes. ? Orange juice. ? Tomato juice. ? Red meats. ? Yogurt.    Have a great trip to [place].  Take care,  Dr. Loni Muse

## 2019-06-19 ENCOUNTER — Encounter: Payer: Self-pay | Admitting: Family Medicine

## 2019-06-20 NOTE — Telephone Encounter (Signed)
Please see message . Thank you .

## 2019-06-26 ENCOUNTER — Encounter: Payer: Self-pay | Admitting: Family Medicine

## 2019-06-26 ENCOUNTER — Other Ambulatory Visit: Payer: Self-pay | Admitting: Family Medicine

## 2019-06-26 DIAGNOSIS — R000 Tachycardia, unspecified: Secondary | ICD-10-CM

## 2019-06-27 NOTE — Telephone Encounter (Signed)
Last fill 03/04/19  #30/3 Last VV 06/12/19

## 2019-07-04 ENCOUNTER — Ambulatory Visit (INDEPENDENT_AMBULATORY_CARE_PROVIDER_SITE_OTHER): Payer: BC Managed Care – PPO | Admitting: Internal Medicine

## 2019-07-04 ENCOUNTER — Encounter: Payer: Self-pay | Admitting: Internal Medicine

## 2019-07-04 ENCOUNTER — Other Ambulatory Visit: Payer: Self-pay

## 2019-07-04 VITALS — BP 142/102 | HR 95 | Ht 67.0 in | Wt 210.5 lb

## 2019-07-04 DIAGNOSIS — R000 Tachycardia, unspecified: Secondary | ICD-10-CM

## 2019-07-04 DIAGNOSIS — I10 Essential (primary) hypertension: Secondary | ICD-10-CM | POA: Diagnosis not present

## 2019-07-04 DIAGNOSIS — Z79899 Other long term (current) drug therapy: Secondary | ICD-10-CM | POA: Diagnosis not present

## 2019-07-04 MED ORDER — LOSARTAN POTASSIUM 25 MG PO TABS: 25.0000 mg | ORAL_TABLET | Freq: Every day | ORAL | 3 refills | Status: DC

## 2019-07-04 NOTE — Patient Instructions (Signed)
Medication Instructions:  Your physician has recommended you make the following change in your medication:  1- START Losartan 25 mg (1 tablet) by mouth once a day.  *If you need a refill on your cardiac medications before your next appointment, please call your pharmacy*  Lab Work: 1) Your physician recommends that you return for lab work in: [place], [place].   2)  Your physician recommends that you return for lab work in: 2 weeks around 07/18/2019 for BMET. - Please go to the [facility]. You will check in at the front desk to the right as you walk into the atrium. Valet Parking is offered if needed. - No appointment needed. You may go any day between 7 am and 6 pm.  If you have labs (blood work) drawn today and your tests are completely normal, you will receive your results only by: Marland Kitchen MyChart Message (if you have MyChart) OR . A paper copy in the mail If you have any lab test that is abnormal or we need to change your treatment, we will call you to review the results.  Testing/Procedures: NONE  Follow-Up: At [facility], you and your health needs are our priority.  As part of our continuing mission to provide you with exceptional heart care, we have created designated Provider Care Teams.  These Care Teams include your primary Cardiologist (physician) and Advanced Practice Providers (APPs -  Physician Assistants and Nurse Practitioners) who all work together to provide you with the care you need, when you need it.  Your next appointment:   1 month with APP.  The format for your next appointment:   In Person  Provider:   You may see one of the following Advanced Practice Providers on your designated Care Team:    Murray Hodgkins, NP  Christell Faith, PA-C  Marrianne Mood, PA-C

## 2019-07-04 NOTE — Progress Notes (Signed)
New Outpatient Visit Date: 07/04/2019  Referring Provider: Lucille Passy, MD [place],   [id number]  Chief Complaint: Elevated blood pressure  HPI:  [name] is a 48 y.o. female who is being seen today for the evaluation of elevated blood pressure and tachycardia at the request of Dr. Deborra Medina. She has a history of hypertension, hypertriglyceridemia, peptic ulcer disease, and asthma.  She had a virtual visit with Dr. Deborra Medina on 06/12/2019, at which time [name] reported a cough.  Lisinopril-HCTZ was transitioned to HCTZ alone.  [name] subsequently reached out to Dr. Deborra Medina noting blood pressure reading of 134/99 and pulse of 115.  Today, [name] reports that she is most concerned about her elevated blood pressure.  Blood pressure has been well controlled for many years while on a beta-blocker.  However, she recently was diagnosed with a severe wheat allergy that may require epinephrine treatment.  She was therefore advised to discontinue beta-blockers.  She is currently on HCTZ alone with elevated blood pressures, particularly diastolic.  Home heart rates have ranged from 95 to 115 bpm.  [name] feels well, denying chest pain, shortness of breath, palpitations, and lightheadedness.  She notes dependent edema when she travels, but otherwise has not had any significant swelling.  She also denies orthopnea.  --------------------------------------------------------------------------------------------------  Cardiovascular History & Procedures: Cardiovascular Problems:  Tachycardia  Hypertension  Risk Factors:  Hypertension, hypertriglyceridemia, and obesity  Cath/PCI:  None  CV Surgery:  None  EP Procedures and Devices:  None  Non-Invasive Evaluation(s):  None  Recent CV Pertinent Labs: Lab Results  Component Value Date   CHOL 199 03/25/2019   HDL 37.50 (L) 03/25/2019   [place]  03/04/2019     Comment:     . LDL cholesterol not calculated.  Triglyceride levels greater than 400 mg/dL invalidate calculated LDL results. . Reference range: <100 . Desirable range <100 mg/dL for primary prevention;   <70 mg/dL for patients with CHD or diabetic patients  with > or = 2 CHD risk factors. Marland Kitchen LDL-C is now calculated using the Martin-Hopkins  calculation, which is a validated novel method providing  better accuracy than the Friedewald equation in the  estimation of LDL-C.  Cresenciano Genre et al. Annamaria Helling. 9417;408(14): 2061-2068  (http://education.[facility].com/faq/FAQ164)    LDLDIRECT 66.0 03/25/2019   TRIG 277.0 (H) 03/25/2019   CHOLHDL 5 03/25/2019   K 3.7 07/04/2019   MG 2.6 (H) 07/04/2019   BUN 15 07/04/2019   CREATININE 0.83 07/04/2019   CREATININE 0.82 03/04/2019    --------------------------------------------------------------------------------------------------  Past Medical History:  Diagnosis Date  . Hypertension     Past Surgical History:  Procedure Laterality Date  . ABLATION    . CESAREAN SECTION    . CHOLECYSTECTOMY    . TUBAL LIGATION      Current Meds  Medication Sig  . Azelastine-Fluticasone 137-50 MCG/ACT SUSP 2 (two) times daily.   . diphenhydrAMINE (BENADRYL) 25 MG tablet Take 25 mg by mouth every 6 (six) hours as needed.  Marland Kitchen EPINEPHrine 0.3 mg/0.3 mL IJ SOAJ injection USE AS NEEDED FOR ANAPHYLAXIS  . hydrochlorothiazide (HYDRODIURIL) 25 MG tablet Take 1 tablet (25 mg total) by mouth daily.  Marland Kitchen ibuprofen (ADVIL) 100 MG tablet Take 100 mg by mouth every 6 (six) hours as needed for fever.  . topiramate (TOPAMAX) 50 MG tablet TAKE 1 TABLET BY MOUTH EVERYDAY AT BEDTIME    Allergies: Codeine, Vicodin [hydrocodone-acetaminophen], Wheat bran, and Lisinopril  Social History  Tobacco Use  . Smoking status: Never Smoker  . Smokeless tobacco: Never Used  Substance Use Topics  . Alcohol use: No    Comment: glass of wine at night  . Drug use: No    Family History  Problem Relation Age of Onset  .  Arthritis Mother   . COPD Father   . Hypertension Father   . Heart failure Father     Review of Systems: A 12-system review of systems was performed and was negative except as noted in the HPI.  --------------------------------------------------------------------------------------------------  Physical Exam: BP (!) 142/102 (BP Location: Right Arm, Patient Position: Sitting, Cuff Size: Normal)   Pulse 95   Ht 5' 7"  (1.702 m)   Wt 210 lb 8 oz (95.5 kg)   SpO2 98%   BMI 32.97 kg/m   General: NAD. HEENT: No conjunctival pallor or scleral icterus.  Facemask in place. Neck: Supple without lymphadenopathy, thyromegaly, JVD, or HJR. No carotid bruit. Lungs: Normal work of breathing. Clear to auscultation bilaterally without wheezes or crackles. Heart: Regular rate and rhythm without murmurs, rubs, or gallops. Non-displaced PMI. Abd: Bowel sounds present. Soft, NT/ND without hepatosplenomegaly Ext: No lower extremity edema. Radial, PT, and DP pulses are 2+ bilaterally Skin: Warm and dry without rash. Neuro: CNIII-XII intact. Strength and fine-touch sensation intact in upper and lower extremities bilaterally. Psych: Normal mood and affect.  EKG: Normal sinus rhythm with isolated PVC.  Otherwise, no significant abnormality  Lab Results  Component Value Date   WBC 7.8 03/04/2019   HGB 14.5 03/04/2019   HCT 42.7 03/04/2019   MCV 90.7 03/04/2019   PLT 258 03/04/2019    Lab Results  Component Value Date   NA 137 07/04/2019   K 3.7 07/04/2019   CL 105 07/04/2019   CO2 21 07/04/2019   BUN 15 07/04/2019   CREATININE 0.83 07/04/2019   GLUCOSE 108 (H) 07/04/2019   ALT 18 03/04/2019    Lab Results  Component Value Date   CHOL 199 03/25/2019   HDL 37.50 (L) 03/25/2019   LDLCALC  03/04/2019     Comment:     . LDL cholesterol not calculated. Triglyceride levels greater than 400 mg/dL invalidate calculated LDL results. . Reference range: <100 . Desirable range <100 mg/dL for  primary prevention;   <70 mg/dL for patients with CHD or diabetic patients  with > or = 2 CHD risk factors. Marland Kitchen LDL-C is now calculated using the Martin-Hopkins  calculation, which is a validated novel method providing  better accuracy than the Friedewald equation in the  estimation of LDL-C.  Cresenciano Genre et al. Annamaria Helling. 8099;833(82): 2061-2068  (http://education.[facility].com/faq/FAQ164)    LDLDIRECT 66.0 03/25/2019   TRIG 277.0 (H) 03/25/2019   CHOLHDL 5 03/25/2019     --------------------------------------------------------------------------------------------------  ASSESSMENT AND PLAN: Hypertension: Blood pressure suboptimally controlled today.  [name] reports that her blood pressures were well controlled with beta-blockers and also while on lisinopril-HCTZ.  Lisinopril was discontinued, however, due to a cough.  I think reasonable treatment options would be addition of an ARB or calcium channel blocker.  Given that she had good blood pressure response and also noted a lower heart rates while on lisinopril, we have agreed to begin losartan 25 mg daily.  I will check a basic metabolic panel today and again in about 2 weeks.  Tachycardia: Blood pressure upper normal today.  TSH  In late June was low normal.  We will defer further work-up today, though if tachycardia remains an issue,  we may need to consider an echocardiogram and trial of nondihydropyridine calcium channel blocker.  Follow-up: Return to clinic in 1 month.  Nelva Bush, MD 07/05/2019 3:39 PM

## 2019-07-05 ENCOUNTER — Encounter: Payer: Self-pay | Admitting: Internal Medicine

## 2019-07-05 DIAGNOSIS — R000 Tachycardia, unspecified: Secondary | ICD-10-CM | POA: Insufficient documentation

## 2019-07-05 LAB — BASIC METABOLIC PANEL
BUN/Creatinine Ratio: 18 (ref 9–23)
BUN: 15 mg/dL (ref 6–24)
CO2: 21 mmol/L (ref 20–29)
Calcium: 8.6 mg/dL — ABNORMAL LOW (ref 8.7–10.2)
Chloride: 105 mmol/L (ref 96–106)
Creatinine, Ser: 0.83 mg/dL (ref 0.57–1.00)
GFR calc Af Amer: 98 mL/min/{1.73_m2} (ref >59)
GFR calc non Af Amer: 85 mL/min/{1.73_m2} (ref >59)
Glucose: 108 mg/dL — ABNORMAL HIGH (ref 65–99)
Potassium: 3.7 mmol/L (ref 3.5–5.2)
Sodium: 137 mmol/L (ref 134–144)

## 2019-07-05 LAB — MAGNESIUM: Magnesium: 2.6 mg/dL — ABNORMAL HIGH (ref 1.6–2.3)

## 2019-07-19 ENCOUNTER — Other Ambulatory Visit: Payer: Self-pay

## 2019-07-19 ENCOUNTER — Other Ambulatory Visit
Admission: RE | Admit: 2019-07-19 | Discharge: 2019-07-19 | Disposition: A | Discharge status: Home / Self Care | Payer: BC Managed Care – PPO | Source: Ambulatory Visit | Attending: Internal Medicine | Admitting: Internal Medicine

## 2019-07-19 DIAGNOSIS — I10 Essential (primary) hypertension: Secondary | ICD-10-CM | POA: Insufficient documentation

## 2019-07-19 DIAGNOSIS — Z79899 Other long term (current) drug therapy: Secondary | ICD-10-CM | POA: Insufficient documentation

## 2019-07-19 LAB — BASIC METABOLIC PANEL
Anion gap: 11 (ref 5–15)
BUN: 17 mg/dL (ref 6–20)
CO2: 27 mmol/L (ref 22–32)
Calcium: 9.1 mg/dL (ref 8.9–10.3)
Chloride: 96 mmol/L — ABNORMAL LOW (ref 98–111)
Creatinine, Ser: 0.84 mg/dL (ref 0.44–1.00)
GFR calc Af Amer: >60 mL/min (ref >60)
GFR calc non Af Amer: >60 mL/min (ref >60)
Glucose, Bld: 108 mg/dL — ABNORMAL HIGH (ref 70–99)
Potassium: 3.4 mmol/L — ABNORMAL LOW (ref 3.5–5.1)
Sodium: 134 mmol/L — ABNORMAL LOW (ref 135–145)

## 2019-08-09 ENCOUNTER — Ambulatory Visit (INDEPENDENT_AMBULATORY_CARE_PROVIDER_SITE_OTHER): Payer: BC Managed Care – PPO | Admitting: Physician Assistant

## 2019-08-09 ENCOUNTER — Encounter: Payer: Self-pay | Admitting: Physician Assistant

## 2019-08-09 ENCOUNTER — Other Ambulatory Visit: Payer: Self-pay

## 2019-08-09 VITALS — BP 124/86 | HR 84 | Temp 97.3°F | Ht 67.0 in | Wt 205.0 lb

## 2019-08-09 DIAGNOSIS — E876 Hypokalemia: Secondary | ICD-10-CM

## 2019-08-09 DIAGNOSIS — R000 Tachycardia, unspecified: Secondary | ICD-10-CM | POA: Diagnosis not present

## 2019-08-09 DIAGNOSIS — I10 Essential (primary) hypertension: Secondary | ICD-10-CM | POA: Diagnosis not present

## 2019-08-09 DIAGNOSIS — Z91018 Allergy to other foods: Secondary | ICD-10-CM

## 2019-08-09 DIAGNOSIS — Z79899 Other long term (current) drug therapy: Secondary | ICD-10-CM

## 2019-08-09 DIAGNOSIS — I493 Ventricular premature depolarization: Secondary | ICD-10-CM

## 2019-08-09 MED ORDER — POTASSIUM CHLORIDE CRYS ER 20 MEQ PO TBCR: 20.0000 meq | EXTENDED_RELEASE_TABLET | Freq: Every day | ORAL | 5 refills | Status: DC

## 2019-08-09 NOTE — Patient Instructions (Signed)
Medication Instructions:  Your physician has recommended you make the following change in your medication:   START Potassium 20 mEq daily. An Rx has been sent to your pharmacy.  *If you need a refill on your cardiac medications before your next appointment, please call your pharmacy*  Lab Work: Your physician recommends that you return for lab work in:  1 week (Bmet, Engineer, materials)   Please have your labs drawn at the [facility]. You do not need an appointment. Their hours are Mon-Fri 7am- 6pm    If you have labs (blood work) drawn today and your tests are completely normal, you will receive your results only by: Marland Kitchen MyChart Message (if you have MyChart) OR . A paper copy in the mail If you have any lab test that is abnormal or we need to change your treatment, we will call you to review the results.  Testing/Procedures: Your physician has requested that you have an echocardiogram. Echocardiography is a painless test that uses sound waves to create images of your heart. It provides your doctor with information about the size and shape of your heart and how well your heart's chambers and valves are working. This procedure takes approximately one hour. There are no restrictions for this procedure.    Follow-Up: At [facility], you and your health needs are our priority.  As part of our continuing mission to provide you with exceptional heart care, we have created designated Provider Care Teams.  These Care Teams include your primary Cardiologist (physician) and Advanced Practice Providers (APPs -  Physician Assistants and Nurse Practitioners) who all work together to provide you with the care you need, when you need it.  Your next appointment:   Pending echo results   The format for your next appointment:   In Person  Provider:    You may see Dr. Saunders Revel or one of the following Advanced Practice Providers on your designated Care Team:    Murray Hodgkins, NP  Christell Faith,  PA-C  Marrianne Mood, PA-C   Other Instructions N/A

## 2019-08-09 NOTE — Progress Notes (Signed)
Office Visit    Patient Name: Alexis Hoffman Date of Encounter: 08/09/2019  Primary Care Provider:  Lucille Passy, MD Primary Cardiologist:  Nelva Bush, MD  Chief Complaint    48 year old female with history of hypertension, sinus tachycardia, hypertriglyceridemia, peptic ulcer disease, tachycardia, severe wheat allergy with recommendation to avoid beta blockers, intolerance to lisinopril 2/2 cough, migraines, and asthma and seen today for follow-up of tachycardia and elevated BP.    Past Medical History    Past Medical History:  Diagnosis Date  . Hypertension    Past Surgical History:  Procedure Laterality Date  . ABLATION    . CESAREAN SECTION    . CHOLECYSTECTOMY    . TUBAL LIGATION      Allergies  Allergies  Allergen Reactions  . Codeine Nausea And Vomiting  . Vicodin [Hydrocodone-Acetaminophen] Hives  . Wheat Bran   . Lisinopril Cough    History of Present Illness    48 yo female with history of HTN and tachycardia being seen today for follow-up of BP and tachycardia after making changes to her previous medication regimen due to a recently diagnosed with allergy with need for an epinephrine pen secondary to anaphylaxis. She has documented intolerance to ACEi with improvement in cough after Lisinopril- HCTZ was changed to HCTZ alone.  With this change, as well as discontinuation of her beta-blocker, she has noted her BP and HR have not been well controlled. Her HR and BP had previously been controlled on a beta-blocker for many years, which was discontinued due to need for epinephrine if anyphylaxis She reported elevated BP 134/99 with HR 115 at her virtual visit with her PCP 06/12/19.  At her most recent visit, she was started on losartan 25 mg daily with SBP still 130s at home per patient. At her visit today, she reports she does not smoke or use any illegal drugs. She drinks a glass of wine on the weekends. She eats healthy, especially due to wheat restrictions.  At home, her highest HR is noted to be 114bpm and lowest HR 45 (always at night) with previous PVCs on EKG. She does note she checks her HR/BP with two different cuffs at home to ensure accuracy, especially with her HR.  She denies any recent CP, palpitations, or feeling of racing HR. No SOB, diaphoresis, presyncope or syncope. No orthopnea or abdominal distention. No nausea, emesis, diarrhea, or constipation. She did note ankle pain, for which she is seeing ortho soon regarding. She was taking ibuprofen 667m daily for her ankle pain but denied any signs or symptoms of bleeding. Due to her ankle, she has noted increased R LEE. This ankle pain has also decreased her ability to be physically active, as it hurts to put weight on it. No recent nausea, emesis, or constipation. No recent fevers or COVID-19 exposure. She reported medication compliance.  Home Medications    Prior to Admission medications   Medication Sig Start Date End Date Taking? Authorizing Provider  Azelastine-Fluticasone 137-50 MCG/ACT SUSP 2 (two) times daily.  02/28/19   [provider]  diphenhydrAMINE (BENADRYL) 25 MG tablet Take 25 mg by mouth every 6 (six) hours as needed.    [provider]  EPINEPHrine 0.3 mg/0.3 mL IJ SOAJ injection USE AS NEEDED FOR ANAPHYLAXIS 01/29/19   [provider]  hydrochlorothiazide (HYDRODIURIL) 25 MG tablet Take 1 tablet (25 mg total) by mouth daily. 06/12/19   ALucille Passy MD  ibuprofen (ADVIL) 100 MG tablet Take 100  mg by mouth every 6 (six) hours as needed for fever.    [provider]  losartan (COZAAR) 25 MG tablet Take 1 tablet (25 mg total) by mouth daily. 07/04/19 10/02/19  End, Harrell Gave, MD  topiramate (TOPAMAX) 50 MG tablet TAKE 1 TABLET BY MOUTH EVERYDAY AT BEDTIME 06/27/19   Lucille Passy, MD    Review of Systems    She denies chest pain, palpitations, dyspnea, pnd, orthopnea, n, v, dizziness, syncope, weight gain, or early satiety. She reports R  LEE 2/2 an ankle injury.  All other systems reviewed and are otherwise negative except as noted above.  Physical Exam    VS:  BP 124/86 (BP Location: Left Arm, Patient Position: Sitting, Cuff Size: Normal)   Pulse 84   Temp (!) 97.3 F (36.3 C)   Ht 5' 7"  (1.702 m)   Wt 205 lb (93 kg)   SpO2 95%   BMI 32.11 kg/m  , BMI Body mass index is 32.11 kg/m. GEN: Well nourished, well developed, in no acute distress. HEENT: normal. Neck: Supple, no JVD, carotid bruits, or masses. Cardiac: RRR, no murmurs, rubs, or gallops. No clubbing, cyanosis. Right LEE around her ankle injury. No LLEE.  Radials  2+ and equal bilaterally.  Respiratory:  Respirations regular and unlabored, clear to auscultation bilaterally. GI: Soft, nontender, nondistended, BS + x 4. MS: no deformity or atrophy. Skin: warm and dry, no rash. Neuro:  Strength and sensation are intact. Psych: Normal affect.  Accessory Clinical Findings    ECG personally reviewed by me today - NSR, 84bpm, - no acute changes.  Assessment & Plan   Sinus Tachycardia --Asymptomatic. Rate controlled today at 85bpm. Notes home ST / higher rates with BP cuff readings. Most recent TSH nl. Obtain echo due to continued elevated rates at home with patient reporting recent HR 110s-45bpm. If echo shows reduced EF or structural abnormality, further ischemic workup with stress test or catheterization can be considered at that time.Otherwise, will plan to start on diltiazem for additional HR and BP support. Future considerations could include Holter/Zio, given her lower reported home HR versus PVCs noted in the past.   PVCs --Asymptomatic. Consider lower HR readings 2/2 PVCs during BP readings. Could consider a Zio in the future as above.  HTN --Continue Losartan with BP more controlled today at 124/86 and patient reporting SBP 130s at home. Schedule echo as above. Pending echo as above, plan to start diltiazem as above and escalate as tolerated for more  optimal BP and HR control. Discussed decreasing ibuprofen use if possible, which she takes for her ongoing ankle pain. Continue to check BP at home. She will bring her BP cuff(s) in at her next appointment to ensure its accuracy.  Hypokalemia --No recent emesis or diarrhea. No alcohol use. Start KCl tab 24mq daily. F/u BMET, Mg in 1 week.  Obesity --Discussed increasing activity and as her ankle pain allows.  Wheat allergy --As above, avoiding BB 2/2 anaphylaxis with possible epinephrine use and recommendation to discontinue.   Disposition: Start potassium KCl tab 262m with recheck of labs in 1 week.  Obtain echocardiogram.  Recommendations regarding medication changes and follow-up s/p echo.   JaArvil ChacoPA-C 08/09/2019, 7:21 PM

## 2019-08-13 ENCOUNTER — Ambulatory Visit (INDEPENDENT_AMBULATORY_CARE_PROVIDER_SITE_OTHER): Payer: BC Managed Care – PPO

## 2019-08-13 ENCOUNTER — Ambulatory Visit: Payer: BC Managed Care – PPO | Admitting: Podiatry

## 2019-08-13 ENCOUNTER — Other Ambulatory Visit: Payer: Self-pay

## 2019-08-13 DIAGNOSIS — R000 Tachycardia, unspecified: Secondary | ICD-10-CM | POA: Diagnosis not present

## 2019-08-14 ENCOUNTER — Telehealth: Payer: Self-pay

## 2019-08-14 DIAGNOSIS — R002 Palpitations: Secondary | ICD-10-CM

## 2019-08-14 MED ORDER — DILTIAZEM HCL ER COATED BEADS 120 MG PO CP24: 120.0000 mg | ORAL_CAPSULE | Freq: Every day | ORAL | 3 refills | Status: DC

## 2019-08-14 NOTE — Telephone Encounter (Signed)
-----   Message from Arvil Chaco, PA-C sent at 08/14/2019  2:25 PM EST ----- Please let Ms. [place] know that her echo showed normal pump function of her heart with normal movement and thickness of the heart wall / heart muscle.  The chambers of her heart were normal size and not enlarged.  She had a mildly leaky mitral valve and trivial leak of her tricuspid valve. Overall, a very reassuring study.   Given the normal pump function of her heart, please have her start Cardizem 165m daily. I also would like to place her on a 2 week Zio monitor, given her concern for lower heart rates at home in the evenings. We will plan to have her follow-up after the results of the Zio or sooner if needed.  (1) Start Cardizem 1269mdaily (2) 2 week Zio (3) F/u in 1 month with primary cardiologist or APP.

## 2019-08-14 NOTE — Telephone Encounter (Signed)
-----   Message from Arvil Chaco, PA-C sent at 08/14/2019  2:25 PM EST ----- Please let Alexis Hoffman know that her echo showed normal pump function of her heart with normal movement and thickness of the heart wall / heart muscle.  The chambers of her heart were normal size and not enlarged.  She had a mildly leaky mitral valve and trivial leak of her tricuspid valve. Overall, a very reassuring study.   Given the normal pump function of her heart, please have her start Cardizem 127m daily. I also would like to place her on a 2 week Zio monitor, given her concern for lower heart rates at home in the evenings. We will plan to have her follow-up after the results of the Zio or sooner if needed.  (1) Start Cardizem 1245mdaily (2) 2 week Zio (3) F/u in 1 month with primary cardiologist or APP.

## 2019-08-14 NOTE — Telephone Encounter (Signed)
Spoke to patient to review echo results. Pt verbalized understanding and wanted to call back for appt.   Rx sent to pharmacy on file.   zio monitor ordered and registered with zio.   Advised pt to call for any further questions or concerns.

## 2019-08-15 MED ORDER — DILTIAZEM HCL ER COATED BEADS 120 MG PO CP24: 120.0000 mg | ORAL_CAPSULE | Freq: Every day | ORAL | 3 refills | Status: AC

## 2019-08-16 ENCOUNTER — Other Ambulatory Visit: Payer: Self-pay | Admitting: Podiatry

## 2019-08-16 ENCOUNTER — Ambulatory Visit (INDEPENDENT_AMBULATORY_CARE_PROVIDER_SITE_OTHER): Payer: BC Managed Care – PPO

## 2019-08-16 ENCOUNTER — Other Ambulatory Visit: Payer: Self-pay

## 2019-08-16 ENCOUNTER — Ambulatory Visit: Payer: BC Managed Care – PPO | Admitting: Podiatry

## 2019-08-16 ENCOUNTER — Encounter: Payer: Self-pay | Admitting: Podiatry

## 2019-08-16 DIAGNOSIS — B070 Plantar wart: Secondary | ICD-10-CM | POA: Diagnosis not present

## 2019-08-16 DIAGNOSIS — M7661 Achilles tendinitis, right leg: Secondary | ICD-10-CM

## 2019-08-16 DIAGNOSIS — M659 Synovitis and tenosynovitis, unspecified: Secondary | ICD-10-CM | POA: Diagnosis not present

## 2019-08-16 DIAGNOSIS — M722 Plantar fascial fibromatosis: Secondary | ICD-10-CM

## 2019-08-16 MED ORDER — METHYLPREDNISOLONE 4 MG PO TBPK: ORAL_TABLET | ORAL | 0 refills | Status: DC

## 2019-08-16 MED ORDER — MELOXICAM 15 MG PO TABS: 15.0000 mg | ORAL_TABLET | Freq: Every day | ORAL | 1 refills | Status: AC

## 2019-08-17 LAB — BASIC METABOLIC PANEL
BUN/Creatinine Ratio: 18 (ref 9–23)
BUN: 14 mg/dL (ref 6–24)
CO2: 22 mmol/L (ref 20–29)
Calcium: 9.3 mg/dL (ref 8.7–10.2)
Chloride: 99 mmol/L (ref 96–106)
Creatinine, Ser: 0.8 mg/dL (ref 0.57–1.00)
GFR calc Af Amer: 103 mL/min/{1.73_m2} (ref >59)
GFR calc non Af Amer: 89 mL/min/{1.73_m2} (ref >59)
Glucose: 99 mg/dL (ref 65–99)
Potassium: 4.3 mmol/L (ref 3.5–5.2)
Sodium: 137 mmol/L (ref 134–144)

## 2019-08-17 LAB — MAGNESIUM: Magnesium: 2.2 mg/dL (ref 1.6–2.3)

## 2019-08-20 ENCOUNTER — Telehealth: Payer: Self-pay

## 2019-08-20 DIAGNOSIS — E876 Hypokalemia: Secondary | ICD-10-CM

## 2019-08-20 NOTE — Progress Notes (Signed)
    HPI: 48 y.o. female presenting today as a new patient with a chief complaint of a painful lesion noted to the plantar left heel that appeared 3-4 months ago. She has been using her son's plantar wart cream with no relief of the symptoms. Walking and applying weight makes the pain worse.  She also complains of sharp pain to the medial right ankle that began about two months ago. She thought the symptoms were secondary to plantar fasciitis but states the pain is now radiating up the leg. She reports associated swelling. She has worn a brace and used Voltaren gel with no significant relief. Patient is here for further evaluation and treatment.    Past Medical History:  Diagnosis Date  . Hypertension        Physical Exam: General: The patient is alert and oriented x3 in no acute distress.  Dermatology: Hyperkeratotic skin lesion(s) noted to the plantar aspect of the left foot approximately 1 cm in diameter. Pinpoint bleeding noted upon debridement. Skin is warm, dry and supple bilateral lower extremities. Negative for open lesions or macerations.  Vascular: Palpable pedal pulses bilaterally. No edema or erythema noted. Capillary refill within normal limits.  Neurological: Epicritic and protective threshold grossly intact bilaterally.   Musculoskeletal Exam: Pain on palpation noted to the posterior tibial tendon of the right foot. Range of motion within normal limits. Muscle strength 5/5 in all muscle groups bilateral lower extremities.  Radiographic Exam:  Normal osseous mineralization. Joint spaces preserved. No fracture or dislocation identified.    Assessment: 1. Posterior tibial tendinitis right  2. Plantar wart left    Plan of Care:  1. Patient was evaluated. Radiographs were reviewed today. 2. Injection of 0.5 mL Celestone Soluspan injected into the posterior tibial tendon sheath.  3. Prescription for Medrol Dose Pak provided to patient. 4. Prescription for Meloxicam  provided to patient. 5. Excisional debridement of plantar wart done using a chisel blade was performed without incident. Light dressing applied.  6. CAM boot dispensed.  7. Return to clinic in 4 weeks.   From [place]. Going for the holidays. 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     Edrick Kins, DPM [facility]  Dr. Edrick Kins, [address]                                        [place], [place] [address]                Office [phone]  Fax [phone]

## 2019-08-20 NOTE — Telephone Encounter (Signed)
-----   Message from Arvil Chaco, PA-C sent at 08/19/2019  6:11 PM EST ----- Please let Alexis Hoffman know that her electrolytes are now back up at goal. Let's have her cut her potassium supplementation down to KCl tab 25mq daily (or 1/2 of her KCl 257m pill each day) and recheck her electrolytes with another BMET in 1 more week. If her electrolytes are stable at that time, we will not need any further labs to recheck.

## 2019-08-20 NOTE — Telephone Encounter (Signed)
Called the patient x2 to give lab results and Jacquelyn's recommendation. Unable to lmom. Pt phone rings out with no voicemail option. Telephone encounter started. Results released to mychart with the provider comments.

## 2019-08-21 MED ORDER — POTASSIUM CHLORIDE CRYS ER 20 MEQ PO TBCR: 10.0000 meq | EXTENDED_RELEASE_TABLET | Freq: Every day | ORAL | Status: AC

## 2019-08-21 NOTE — Telephone Encounter (Signed)
Spoke with the patient. Patient reviewed her lab results and Jacquelyn's recommendation in mychart. Order is in [place] for 1 wk repeat [facility]. Pt med list updated. Pt has no questions at this time.

## 2019-08-24 ENCOUNTER — Ambulatory Visit (INDEPENDENT_AMBULATORY_CARE_PROVIDER_SITE_OTHER): Payer: BC Managed Care – PPO

## 2019-08-24 DIAGNOSIS — R002 Palpitations: Secondary | ICD-10-CM

## 2019-08-27 ENCOUNTER — Other Ambulatory Visit: Payer: Self-pay

## 2019-08-27 ENCOUNTER — Other Ambulatory Visit
Admission: RE | Admit: 2019-08-27 | Discharge: 2019-08-27 | Disposition: A | Discharge status: Home / Self Care | Payer: BC Managed Care – PPO | Source: Ambulatory Visit | Attending: Physician Assistant | Admitting: Physician Assistant

## 2019-08-27 DIAGNOSIS — E876 Hypokalemia: Secondary | ICD-10-CM | POA: Insufficient documentation

## 2019-08-27 LAB — BASIC METABOLIC PANEL
Anion gap: 7 (ref 5–15)
BUN: 15 mg/dL (ref 6–20)
CO2: 26 mmol/L (ref 22–32)
Calcium: 9 mg/dL (ref 8.9–10.3)
Chloride: 103 mmol/L (ref 98–111)
Creatinine, Ser: 0.86 mg/dL (ref 0.44–1.00)
GFR calc Af Amer: >60 mL/min (ref >60)
GFR calc non Af Amer: >60 mL/min (ref >60)
Glucose, Bld: 100 mg/dL — ABNORMAL HIGH (ref 70–99)
Potassium: 4 mmol/L (ref 3.5–5.1)
Sodium: 136 mmol/L (ref 135–145)

## 2019-09-13 ENCOUNTER — Other Ambulatory Visit: Payer: Self-pay

## 2019-09-13 ENCOUNTER — Encounter: Payer: Self-pay | Admitting: Podiatry

## 2019-09-13 ENCOUNTER — Ambulatory Visit: Payer: BC Managed Care – PPO | Admitting: Podiatry

## 2019-09-13 DIAGNOSIS — M7661 Achilles tendinitis, right leg: Secondary | ICD-10-CM

## 2019-09-13 DIAGNOSIS — B070 Plantar wart: Secondary | ICD-10-CM | POA: Diagnosis not present

## 2019-09-16 NOTE — Progress Notes (Signed)
    HPI: 49 y.o. female presenting today for follow up evaluation of posterior tibial tendinitis of the right foot and a plantar wart of the left foot. She states the right foot is still hurting and states the pain is worse at night time. She denies modifying factors and has been using the CAM boot as directed.  She states the left foot has improved. She denies any pain or modifying factors. She has not done anything at home for treatment. Patient is here for further evaluation and treatment.   Past Medical History:  Diagnosis Date  . Hypertension        Physical Exam: General: The patient is alert and oriented x3 in no acute distress.  Dermatology: Hyperkeratotic skin lesion(s) noted to the plantar aspect of the left foot approximately 1 cm in diameter. Pinpoint bleeding noted upon debridement. Skin is warm, dry and supple bilateral lower extremities. Negative for open lesions or macerations.  Vascular: Palpable pedal pulses bilaterally. No edema or erythema noted. Capillary refill within normal limits.  Neurological: Epicritic and protective threshold grossly intact bilaterally.   Musculoskeletal Exam: Pain on palpation noted to the posterior tibial tendon of the right foot. Range of motion within normal limits. Muscle strength 5/5 in all muscle groups bilateral lower extremities.  Assessment: 1. Posterior tibial tendinitis right - improved  2. Plantar wart left - resolved    Plan of Care:  1. Patient was evaluated. 2. Injection of 0.5 mL Celestone Soluspan injected into the posterior tibial tendon sheath.  3. Ankle brace dispensed. Discontinue using CAM boot.  4. Continue taking Meloxicam daily.  5. Patient states that her insurance will not cover physical therapy or an MRI so we will hold off on those for now.  6. Return to clinic in 4 weeks.    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     Edrick Kins, DPM [facility]  Dr. Edrick Kins, [address]                                        [place], [place] [address]                Office [phone]  Fax [phone]

## 2019-09-24 NOTE — Progress Notes (Signed)
Office Visit    Patient Name: Alexis Hoffman Date of Encounter: 10/01/2019  Primary Care Provider:  Lucille Passy, MD Primary Cardiologist:  Nelva Bush, MD  Chief Complaint    49 year old female with history of hypertension, sinus tachycardia, hypertriglyceridemia, peptic ulcer disease, sinus tachycardia, Mobitz type I, severe wheat allergy with recommendation to avoid beta blockers, intolerance to lisinopril 2/2 cough, migraines, asthma, and seen today for follow-up of Zio monitor.    Past Medical History    Past Medical History:  Diagnosis Date  . Hypertension    Past Surgical History:  Procedure Laterality Date  . ABLATION    . CESAREAN SECTION    . CHOLECYSTECTOMY    . TUBAL LIGATION      Allergies  Allergies  Allergen Reactions  . Codeine Nausea And Vomiting  . Vicodin [Hydrocodone-Acetaminophen] Hives  . Wheat Bran   . Lisinopril Cough    History of Present Illness    49 yo female with history of HTN and tachycardia being seen today for follow-up of her Zio monitor. She does not smoke or use any illegal drugs. She drinks a glass of wine on the weekends. She eats healthy, especially due to wheat restrictions. She was previously seen with Zio placed 2/2 changes made to her medication regimen due to a recently diagnosed wheat allergy with need for an epinephrine pen secondary to anaphylaxis. She has documented intolerance to ACEi with improvement in cough after Lisinopril- HCTZ was changed to HCTZ alone.  With this change, as well as discontinuation of her beta-blocker, she noted her BP and HR were then not well controlled. Her HR and BP had previously been controlled on a beta-blocker for many years; however, this was discontinued due to need for epinephrine if anyphylaxis She reported elevated BP 134/99 with HR 115 at her virtual visit with her PCP 06/12/19.  She was started on losartan 25 mg daily with home SBP still 130s. At home, her highest HR was then noted to  be 114bpm and lowest HR 45 (always at night) with previous PVCs on EKG. She was checking her vitals with two different cuffs at home to ensure accuracy, especially with her HR.  She noted ankle pain, for which she is seeing ortho soon regarding. She was taking ibuprofen 652m daily for her ankle pain but denied any signs or symptoms of bleeding. Due to her ankle, she has noted increased R LEE. This ankle pain had decreased her ability to be physically active, as it hurt to put weight on it.   She was last seen in clinic 12/4, at which time she reported ongoing issues with HR and BP. A repeat echo showed EF normal; therefore, she was started on low dose Cardizem CD 1258mdaily. She reported slower HR at home in the evenings; therefore, a Zio was also placed with predominant rhythm NSR and average rate of 60 bpm (range 31 to 207 bpm).  Rare PACs and PVCs were observed.  There are brief episodes of Mobitz type I/second-degree AV block in the overnight hours.  Sleep study was recommended with patient stating that sleep apnea does run in her family (father) and that she does snore at night.  No sustained arrhythmia or prolonged pauses were noted. She was noted to be hypokalemic on follow-up / office labs and thus started on potassium supplementation with successful repletion.    Since that time, she reports that she has been under a significant amount of personal stress.  She is separating from her husband and planning a move to [place] with her 3 children (eldest 84, youngest 32) to be closer to family in the area.  She estimates the date of this move as 11/01/19; therefore, she is currently trying to both sell her house here in [place] and obtain a place down in [place]. 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 She then spends the majority of her day packing to move her family.  She has noted ankle issues since tripping on a hike while out  camping a while ago, and recently aggravated this same ankle when she tripped while moving boxes up and down the stairs. Today, she is in an ankle brace, provided by her specialist. She is getting ankle cortisone injections with last injection reported 2.5 weeks ago and upcoming cortisone injection next week.  Due to the current hectic lifestyle, she has not been checking her blood pressure as frequently but does note elevated pressures with her highest SBP 138 and DBP is 113.  BP in the office today 130/86.  She does have new LEE but with consideration of starting Cardizem since her last visit and also with weight actually down from her previous visit (205   203 pounds).  She has been tolerating the Cardizem well and does not notice the LEE.  She reports an increase in exercise, due to having to go up and down the stairs for the move. No chest pain or shortness of breath with exertion or at rest.  She occasionally notes HR up to 203 bpm on her apple watch but denies any associated symptoms of faster heart rate, palpitations, presyncope, or LOC.  No orthopnea, PND, abdominal distention, or early satiety. She continues to avoid caffeine and notes 1 glass of red wine on the weekends. She does not smoke or use drugs. She reports medication compliance. She plans to find a cardiologist, PCP, and pulmonologist (for sleep study) in [place].  Home Medications    Prior to Admission medications   Medication Sig Start Date End Date Taking? Authorizing Provider  Azelastine-Fluticasone 137-50 MCG/ACT SUSP 2 (two) times daily.  02/28/19   [provider]  diltiazem (CARDIZEM CD) 120 MG 24 hr capsule Take 1 capsule (120 mg total) by mouth daily. 08/15/19 11/13/19  Marrianne Mood D, PA-C  diphenhydrAMINE (BENADRYL) 25 MG tablet Take 25 mg by mouth every 6 (six) hours as needed.    [provider]  EPINEPHrine 0.3 mg/0.3 mL IJ SOAJ injection USE AS NEEDED FOR ANAPHYLAXIS 01/29/19   [provider]   hydrochlorothiazide (HYDRODIURIL) 25 MG tablet Take 1 tablet (25 mg total) by mouth daily. 06/12/19   Lucille Passy, MD  ibuprofen (ADVIL) 100 MG tablet Take 100 mg by mouth every 6 (six) hours as needed for fever.    [provider]  losartan (COZAAR) 25 MG tablet Take 1 tablet (25 mg total) by mouth daily. 07/04/19 10/02/19  End, Harrell Gave, MD  meloxicam (MOBIC) 15 MG tablet Take 1 tablet (15 mg total) by mouth daily. 08/16/19   Edrick Kins, DPM  methylPREDNISolone (MEDROL DOSEPAK) 4 MG TBPK tablet 6 day dose pack - take as directed 08/16/19   Edrick Kins, DPM  potassium chloride SA (KLOR-CON) 20 MEQ tablet Take 0.5 tablets (10 mEq total) by mouth daily. 08/21/19   Marrianne Mood D, PA-C  topiramate (TOPAMAX) 50 MG tablet TAKE 1 TABLET  BY MOUTH EVERYDAY AT BEDTIME 06/27/19   Lucille Passy, MD    Review of Systems    She denies chest pain, palpitations, dyspnea, pnd, orthopnea, n, v, dizziness, syncope, edema, weight gain, or early satiety.  All other systems reviewed and are otherwise negative except as noted above.  Physical Exam    VS:  BP 130/86 (BP Location: Left Arm, Patient Position: Sitting, Cuff Size: Normal)   Pulse 89   Ht 5' 7"  (1.702 m)   Wt 203 lb (92.1 kg)   SpO2 99%   BMI 31.79 kg/m  , BMI Body mass index is 31.79 kg/m. GEN: Well nourished, well developed, in no acute distress. HEENT: normal. Neck: Supple, no JVD, carotid bruits, or masses. Cardiac: RRR, no murmurs, rubs, or gallops. No clubbing, cyanosis. B/l 1+ edema worse on L than R (with brace noted on R ankle).  Radials/DP/PT 2+ and equal bilaterally.  Respiratory:  Respirations regular and unlabored, clear to auscultation bilaterally. GI: Soft, nontender, nondistended, BS + x 4. MS: no deformity or atrophy. Skin: warm and dry, no rash. Neuro:  Strength and sensation are intact. Psych: Normal affect.  Accessory Clinical Findings    ECG personally reviewed by me today - NSR, 89bpm - no  acute changes.  Filed Weights   10/01/19 1012  Weight: 203 lb (92.1 kg)    Echo 08/13/19 1. Left ventricular ejection fraction, by visual estimation, is 55 to 60%. The left ventricle has normal function. There is no left ventricular hypertrophy.  2. The left ventricle has no regional wall motion abnormalities.  3. Global right ventricle has normal systolic function.The right ventricular size is normal. No increase in right ventricular wall thickness.  4. Left atrial size was normal.  5. Right atrial size was normal.  6. The mitral valve is normal in structure. Mild mitral valve regurgitation.  7. The tricuspid valve is normal in structure. Tricuspid valve regurgitation is trivial.  8. The aortic valve was not well visualized. Aortic valve regurgitation is not visualized. No evidence of aortic valve sclerosis or stenosis.  9. The pulmonic valve was not well visualized. Pulmonic valve regurgitation is not visualized. 10. TR signal is inadequate for assessing pulmonary artery systolic pressure. 11. The inferior vena cava is normal in size with greater than 50% respiratory variability, suggesting right atrial pressure of 3 mmHg.  Zio 09/24/19  The patient was monitored 12 days, 21 hours.  The predominant rhythm was sinus with an average rate of 60 bpm (range 31 to 207 bpm).  Rare PACs and PVCs were observed.  There were brief episodes of Mobitz type I second-degree AV block in the overnight hours. Otherwise, no sustained arrhythmia or prolonged pause was seen.  There were no patient triggered events. Predominantly sinus rhythm with rare PACs and PVCs.  Brief Mobitz type I second-degree AV block noted in the overnight hours.  Labs 08/27/19 Sodium 136, potassium 4.0, glucose 100, creatinine 0.86, BUN 15 02/2019 TSH 0.76 2017 LDL 122, total cholesterol 199  Assessment & Plan    Sinus tachycardia -Continues to remain asymptomatic with sinus tachycardia.  No dizziness or shortness of  breath.  Previous TSH normal and most recent echo as above with normal EF.  She was started on diltiazem for additional heart rate control and has been tolerating this well. Exam today shows increased LEE with suspicion this is 2/2 CCB/diltiazem; however, she had not noticed the LEE until our exam and is not bothered by it at this time.  She continues to note elevated home pressure and HR despite the start of Cardizem with apple watch showing heart rate up to 203 bpm when at rest last night.  She does note increased stressors, which she states may be contributing to her continued elevated HR and BP. HR well controlled on today's visit with BP slightly elevated and recommendations as below. ZIO monitor performed and showed predominantly NSR with brief Mobitz type I in the overnight hours; therefore, further escalation of Cardizem deferred with sleep study recommended as outlined below.   Snoring, Mobitz Type I --As above, ZIO monitor performed and showed predominantly NSR with brief Mobitz type I in the overnight hours. No significant pauses. No presyncope or LOC / no sx with slower rates. Due to these findings of slower HR overnight, as well as family Hx of sleep apnea with reports that she does snore, sleep study has been recommended with patient stating today that she intends to follow-up with pulmonology in [place] (due to her upcoming move to [facility]).    PVCs -Asymptomatic.  ZIO as above showed rare ectopy.  Continue Cardizem at current dose.  Essential hypertension --BP elevated from previous visit at 130/86 with patient reporting home max SBP 138 and DBP 113. If renal function stable on labs, increase to losartan 37.5 mg daily with recheck of renal function in 1 week. Escalation of CCB deferred in setting of LEE noted on exam likely 2/2 start of CCB and slower rates in PM / Mobitz I. Considered also was  increase of HCTZ; however, this was deferred as otherwise euvolemic on exam and given h/o hypokalemia  with upcoming move to [facility]. She will continue to monitor BP at home and discontinue NSAID use when able due to long term BP/cardiovascular and renal effects of long term use.  Asymmetric LEE --No additional sx of heart failure with patient euvolemic on exam other than LEE, which started after initiation of Cardizem. Recent echo as above and nl EF. Weight down from previous appointment. Suspect increase in LEE 2/2 start of Cardizem over increased volume with patient reporting today that she tolerates the LEE (not bothered by it).  Continue current dose of Cardizem with increase in dose deferred for now. L LEE > R LEE; however, low suspicion for DVT at this time given no reported pain or additional sx. Exam also not consistent with DVT, and she denies any increase in risk factors for DVT today (and in fact reports increased activity). Asymmetric LEE suspected as 2/2 compression from ankle brace.   Obesity --Weight decrease from previous appointment with BMI 31.79kg/m2 today.  Continue to remain active as ankle pain allows.  H/o Hypokalemia -Resolved.  We will recheck a BMET today, given plan to increase losartan.  Continue potassium supplementation with KCl tab 20 M EQ daily.  Wheat allergy --No beta-blockers 2/2 anaphylaxis with epinephrine use and noted recommendation to discontinue.  Continue diltiazem for heart rate and BP control.  Hyperlipidemia / Hypertriglyceridemia  --Current 10 year ASCVD risk low at 2.1% and lifetime ASCVD risk 39% with optimal risk 0.6%. Continue to monitor with initiation of statin if indicated. Continue to monitor triglycerides with most recent triglycerides 277.0.  ----- Disposition: If renal function stable on today's BMET, increase to losartan to 37.5 mg daily for more optimal BP control and recheck BMET in 1 week.  She will follow up with a cardiologist in [place] and contact her office for transfer of medical records at that time.  She is aware that  she can contact  the office if any new symptoms before her move.  Arvil Chaco, PA-C 10/01/2019, 10:19 AM

## 2019-09-26 ENCOUNTER — Other Ambulatory Visit: Payer: Self-pay

## 2019-09-26 DIAGNOSIS — R001 Bradycardia, unspecified: Secondary | ICD-10-CM

## 2019-10-01 ENCOUNTER — Other Ambulatory Visit: Payer: Self-pay

## 2019-10-01 ENCOUNTER — Ambulatory Visit (INDEPENDENT_AMBULATORY_CARE_PROVIDER_SITE_OTHER): Payer: BC Managed Care – PPO | Admitting: Physician Assistant

## 2019-10-01 ENCOUNTER — Encounter: Payer: Self-pay | Admitting: Physician Assistant

## 2019-10-01 VITALS — BP 130/86 | HR 89 | Ht 67.0 in | Wt 203.0 lb

## 2019-10-01 DIAGNOSIS — R002 Palpitations: Secondary | ICD-10-CM

## 2019-10-01 DIAGNOSIS — Z8639 Personal history of other endocrine, nutritional and metabolic disease: Secondary | ICD-10-CM

## 2019-10-01 DIAGNOSIS — R000 Tachycardia, unspecified: Secondary | ICD-10-CM | POA: Diagnosis not present

## 2019-10-01 DIAGNOSIS — I493 Ventricular premature depolarization: Secondary | ICD-10-CM

## 2019-10-01 DIAGNOSIS — Z91018 Allergy to other foods: Secondary | ICD-10-CM

## 2019-10-01 DIAGNOSIS — Z79899 Other long term (current) drug therapy: Secondary | ICD-10-CM

## 2019-10-01 DIAGNOSIS — G43009 Migraine without aura, not intractable, without status migrainosus: Secondary | ICD-10-CM

## 2019-10-01 DIAGNOSIS — R0683 Snoring: Secondary | ICD-10-CM

## 2019-10-01 DIAGNOSIS — R609 Edema, unspecified: Secondary | ICD-10-CM

## 2019-10-01 DIAGNOSIS — I441 Atrioventricular block, second degree: Secondary | ICD-10-CM | POA: Diagnosis not present

## 2019-10-01 DIAGNOSIS — I10 Essential (primary) hypertension: Secondary | ICD-10-CM

## 2019-10-01 DIAGNOSIS — E669 Obesity, unspecified: Secondary | ICD-10-CM

## 2019-10-01 NOTE — Patient Instructions (Signed)
Medication Instructions:  Your physician recommends that you continue on your current medications as directed. Please refer to the Current Medication list given to you today.  *If you need a refill on your cardiac medications before your next appointment, please call your pharmacy*  Lab Work: Your physician recommends that you have lab work today(BMET)  If you have labs (blood work) drawn today and your tests are completely normal, you will receive your results only by: Marland Kitchen MyChart Message (if you have MyChart) OR . A paper copy in the mail If you have any lab test that is abnormal or we need to change your treatment, we will call you to review the results.  Testing/Procedures: None ordered   Follow-Up: At [facility], you and your health needs are our priority.  As part of our continuing mission to provide you with exceptional heart care, we have created designated Provider Care Teams.  These Care Teams include your primary Cardiologist (physician) and Advanced Practice Providers (APPs -  Physician Assistants and Nurse Practitioners) who all work together to provide you with the care you need, when you need it.  Your next appointment:   Please establish care in [place].   Please let us know if we can help you at all in the future.  Thank you for allowing [place] to care for you!  Medical records

## 2019-10-02 LAB — BASIC METABOLIC PANEL
BUN/Creatinine Ratio: 11 (ref 9–23)
BUN: 10 mg/dL (ref 6–24)
CO2: 22 mmol/L (ref 20–29)
Calcium: 9.1 mg/dL (ref 8.7–10.2)
Chloride: 104 mmol/L (ref 96–106)
Creatinine, Ser: 0.9 mg/dL (ref 0.57–1.00)
GFR calc Af Amer: 89 mL/min/{1.73_m2} (ref >59)
GFR calc non Af Amer: 77 mL/min/{1.73_m2} (ref >59)
Glucose: 102 mg/dL — ABNORMAL HIGH (ref 65–99)
Potassium: 3.9 mmol/L (ref 3.5–5.2)
Sodium: 140 mmol/L (ref 134–144)

## 2019-10-03 ENCOUNTER — Telehealth: Payer: Self-pay

## 2019-10-03 ENCOUNTER — Other Ambulatory Visit: Payer: Self-pay

## 2019-10-03 ENCOUNTER — Telehealth: Payer: Self-pay | Admitting: *Deleted

## 2019-10-03 DIAGNOSIS — Z79899 Other long term (current) drug therapy: Secondary | ICD-10-CM

## 2019-10-03 DIAGNOSIS — I10 Essential (primary) hypertension: Secondary | ICD-10-CM

## 2019-10-03 MED ORDER — LOSARTAN POTASSIUM 25 MG PO TABS: 37.5000 mg | ORAL_TABLET | Freq: Every day | ORAL | 3 refills | Status: AC

## 2019-10-03 NOTE — Telephone Encounter (Signed)
-----   Message from Arvil Chaco, PA-C sent at 10/02/2019  8:11 PM EST ----- Renal function stable. Please increase her Losartan to 37.73m daily with recheck of renal function / BMET and Mg in 1 week. Please have her continue to track her BP with this increased dose, as we can make further med changes [facility] while she finds [facility]) if this change does not accurately control her pressures. Call the office if any concerns. (1) Start Losartan 37.568m (2) BMET/Mg in 1 week

## 2019-10-03 NOTE — Telephone Encounter (Signed)
Patient already aware. See previous encounter.

## 2019-10-03 NOTE — Telephone Encounter (Signed)
-----   Message from Arvil Chaco, PA-C sent at 10/02/2019  8:11 PM EST ----- Renal function stable. Please increase her Losartan to 37.46m daily with recheck of renal function / BMET and Mg in 1 week. Please have her continue to track her BP with this increased dose, as we can make further med changes before [facility] while she finds [facility]) if this change does not accurately control her pressures. Call the office if any concerns. (1) Start Losartan 37.557m (2) BMET/Mg in 1 week

## 2019-10-03 NOTE — Telephone Encounter (Signed)
Call to patient to review lab results and discuss poc.   Pt verbalized understanding. Requested that repeat labs be taken at outpatient [facility] on [address]. Will fax lab req to this location.  Rx updated and sent to pt preferred pharmacy.   Advised pt to call for any further questions or concerns.

## 2019-10-03 NOTE — Telephone Encounter (Signed)
No answer. Left message to call back.   

## 2019-10-05 ENCOUNTER — Other Ambulatory Visit: Payer: Self-pay | Admitting: Podiatry

## 2019-10-11 ENCOUNTER — Encounter: Payer: Self-pay | Admitting: Podiatry

## 2019-10-11 ENCOUNTER — Other Ambulatory Visit: Payer: Self-pay

## 2019-10-11 ENCOUNTER — Ambulatory Visit: Payer: BC Managed Care – PPO | Admitting: Podiatry

## 2019-10-11 DIAGNOSIS — M76821 Posterior tibial tendinitis, right leg: Secondary | ICD-10-CM

## 2019-10-11 DIAGNOSIS — M7661 Achilles tendinitis, right leg: Secondary | ICD-10-CM | POA: Diagnosis not present

## 2019-10-11 MED ORDER — MELOXICAM 15 MG PO TABS: 15.0000 mg | ORAL_TABLET | Freq: Every day | ORAL | 1 refills | Status: AC

## 2019-10-12 LAB — BASIC METABOLIC PANEL
BUN/Creatinine Ratio: 17 (ref 9–23)
BUN: 15 mg/dL (ref 6–24)
CO2: 25 mmol/L (ref 20–29)
Calcium: 9.9 mg/dL (ref 8.7–10.2)
Chloride: 100 mmol/L (ref 96–106)
Creatinine, Ser: 0.86 mg/dL (ref 0.57–1.00)
GFR calc Af Amer: 94 mL/min/{1.73_m2} (ref >59)
GFR calc non Af Amer: 81 mL/min/{1.73_m2} (ref >59)
Glucose: 92 mg/dL (ref 65–99)
Potassium: 4.2 mmol/L (ref 3.5–5.2)
Sodium: 140 mmol/L (ref 134–144)

## 2019-10-12 LAB — MAGNESIUM: Magnesium: 2.2 mg/dL (ref 1.6–2.3)

## 2019-10-14 NOTE — Progress Notes (Signed)
    HPI: 49 y.o. female presenting today for follow up evaluation of posterior tibial tendinitis of the right lower extremity. She states the pain has not improved and is constant. She has been using the ankle brace which helps lessen the severity of the pain. She has been taking Meloxicam which provided no significant relief. Walking and being on the foot excessively increases the pain. Patient is here for further evaluation and treatment.   Past Medical History:  Diagnosis Date  . Allergy to wheat    Not able to have BB  . Hypertension   . Mobitz (type) I [facility]) atrioventricular block    Zio 1/21  . Sinus tachycardia    Zio 2021, allergy to BB  . Snoring    Sleep study recommended, FHx of sleep apnea       Physical Exam: General: The patient is alert and oriented x3 in no acute distress.  Dermatology: Skin is warm, dry and supple bilateral lower extremities. Negative for open lesions or macerations.  Vascular: Palpable pedal pulses bilaterally. No edema or erythema noted. Capillary refill within normal limits.  Neurological: Epicritic and protective threshold grossly intact bilaterally.   Musculoskeletal Exam: Pain on palpation noted to the posterior tibial tendon of the right foot. Range of motion within normal limits. Muscle strength 5/5 in all muscle groups bilateral lower extremities.  Assessment: 1. Posterior tibial tendinitis right    Plan of Care:  1. Patient was evaluated.  2. Injection of 0.5 mL Celestone Soluspan injected into the posterior tibial tendon sheath.  3. Continue taking Meloxicam. Refill prescription provided to patient. 4. Continue using ankle brace.  5. Recommended MRI after patient moves to [facility] if no improvement.  6. Return to clinic as needed. Patient moving to [facility] in 2 weeks.    Edrick Kins, DPM [facility]  Dr. Edrick Kins, [address]                                        [place], [place] [address]                 Office [phone]  Fax [phone]

## 2019-12-15 ENCOUNTER — Other Ambulatory Visit: Payer: Self-pay | Admitting: Podiatry
